# Patient Record
Sex: Female | Born: 1935 | Race: White | Hispanic: No | Marital: Married | Smoking: Never smoker
Health system: Southern US, Community
[De-identification: ages and names within clinical notes are randomized; demographics above are authoritative.]

## PROBLEM LIST (undated history)

## (undated) DIAGNOSIS — F0390 Unspecified dementia without behavioral disturbance: Secondary | ICD-10-CM

---

## 1998-01-24 ENCOUNTER — Emergency Department (HOSPITAL_COMMUNITY): Admission: EM | Admit: 1998-01-24 | Discharge: 1998-01-24 | Payer: Self-pay | Admitting: Emergency Medicine

## 1998-04-14 ENCOUNTER — Ambulatory Visit (HOSPITAL_COMMUNITY): Admission: RE | Admit: 1998-04-14 | Discharge: 1998-04-14 | Payer: Self-pay | Admitting: Gastroenterology

## 2001-11-15 ENCOUNTER — Other Ambulatory Visit: Admission: RE | Admit: 2001-11-15 | Discharge: 2001-11-15 | Payer: Self-pay | Admitting: Internal Medicine

## 2003-06-25 ENCOUNTER — Ambulatory Visit (HOSPITAL_COMMUNITY): Admission: RE | Admit: 2003-06-25 | Discharge: 2003-06-25 | Payer: Self-pay | Admitting: *Deleted

## 2005-11-09 ENCOUNTER — Other Ambulatory Visit: Admission: RE | Admit: 2005-11-09 | Discharge: 2005-11-09 | Payer: Self-pay | Admitting: Internal Medicine

## 2008-04-30 ENCOUNTER — Encounter (INDEPENDENT_AMBULATORY_CARE_PROVIDER_SITE_OTHER): Payer: Self-pay | Admitting: *Deleted

## 2008-04-30 ENCOUNTER — Ambulatory Visit (HOSPITAL_COMMUNITY): Admission: RE | Admit: 2008-04-30 | Discharge: 2008-04-30 | Payer: Self-pay | Admitting: *Deleted

## 2010-12-21 NOTE — Op Note (Signed)
NAME:  Gabriela Middleton, Gabriela Middleton NO.:  [id number]   MEDICAL RECORD NO.:  [id number]          PATIENT TYPE:  AMB   LOCATION:  ENDO                         FACILITY:  [facility]   PHYSICIAN:  Georgiana Spinner, M.D.    DATE OF BIRTH:  1924-01-18   DATE OF PROCEDURE:  04/30/2008  DATE OF DISCHARGE:                               OPERATIVE REPORT   PROCEDURE PERFORMED:  Upper endoscopy.   ENDOSCOPIST:  Georgiana Spinner, M.D.   INDICATIONS:  Abdominal pain.   ANESTHESIA:  Fentanyl 50 mcg and Versed 5 mg.   DESCRIPTION OF THE PROCEDURE:  With the patient mildly sedated in the  left lateral decubitus position the Pentax videoscopic endoscope was  inserted in the mouth and  passed under direct vision through the  esophagus, which appeared normal until we reached the distal esophagus.  There appeared to be an ulcer-related to probably reflux, which was  photographed and subsequently biopsied.   We entered into the stomach.  Fundus, body, antrum, duodenal bulb, and  second portion of the duodenum appeared normal.  From this point the  endoscope was slowly withdrawn taking circumferential views of duodenal  mucosa until the endoscope had been pulled back into stomach and placed  in retroflexion to view the stomach from below.   The endoscope was straightened and withdrawn taking circumferential  views of the remaining gastric and esophageal mucosa.   The patient's vital signs and pulse oximetry had remained stable.  The  patient tolerated procedure well with no apparent complications.   FINDINGS:  Loose wrap of the gastroesophageal junction around the  endoscope indicating laxity of the lower esophageal sphincter and an  esophageal ulcer was seen, photographed and biopsied.  We will await  biopsy report.  The patient will call me for the results and follow up  with me as an outpatient.  We will proceed with colonoscopy as planned.           ______________________________  Georgiana Spinner, M.D.     GMO/MEDQ  D:  04/30/2008  T:  05/01/2008  Job:  [id number]

## 2010-12-21 NOTE — Op Note (Signed)
NAME:  Gabriela Middleton, LYNDE NO.:  [id number]   MEDICAL RECORD NO.:  [id number]          PATIENT TYPE:  AMB   LOCATION:  ENDO                         FACILITY:  [facility]   PHYSICIAN:  Georgiana Spinner, M.D.    DATE OF BIRTH:  1924-01-21   DATE OF PROCEDURE:  DATE OF DISCHARGE:                               OPERATIVE REPORT   PROCEDURE:  Colonoscopy.   INDICATIONS:  Abdominal pain, colon cancer screening.   ANESTHESIA:  Fentanyl 25 mcg, Versed 2.5 mg.   DESCRIPTION OF PROCEDURE:  With the patient mildly sedated in the left  lateral decubitus position, the Pentax videoscopic pediatric colonoscope  was inserted in the rectum and passed under direct vision.  With  pressure applied and the patient rolled to her back and subsequently to  her right side, we were able to reach the cecum.  We identified the base  of cecum and ileocecal valve, both of which were photographed.  From  this point, colonoscope was slowly withdrawn, taking circumferential  views of colonic mucosa, stopping only in the rectum which appeared  normal on direct and showed hemorrhoids on retroflexed view.  The  endoscope was straightened and withdrawn.  The patient's vital signs and  pulse oximeter remained stable.  The patient tolerated the procedure  well without apparent complication.   FINDINGS:  Internal hemorrhoids.  Otherwise, an unremarkable  colonoscopic examination to the cecum.   PLAN:  See endoscopy note for further details.           ______________________________  Georgiana Spinner, M.D.     GMO/MEDQ  D:  04/30/2008  T:  05/01/2008  Job:  [id number]

## 2010-12-24 NOTE — Op Note (Signed)
NAME:  Gabriela Middleton, Gabriela Middleton                         ACCOUNT NO.:  [id number]   MEDICAL RECORD NO.:  [id number]                   PATIENT TYPE:  AMB   LOCATION:  ENDO                                 [facility]ACILITY:  [facility]   PHYSICIAN:  Georgiana Spinner, M.D.                 DATE OF BIRTH:  07-14-1924   DATE OF PROCEDURE:  DATE OF DISCHARGE:                                 OPERATIVE REPORT   PROCEDURE:  Colonoscopy.   INDICATIONS FOR PROCEDURE:  Colon polyps.   ANESTHESIA:  Demerol 40, Versed 4 mg, Phenergan 12.5 mg were given.   DESCRIPTION OF PROCEDURE:  With the patient mildly sedated in the left  lateral decubitus position, the Olympus videoscopic colonoscope was inserted  into the rectum and passed under direct vision to the cecum identified by  the ileocecal valve and appendiceal orifice both of which were photographed.  From this point, the colonoscope was slowly withdrawn taking circumferential  views of the colonic mucosa stopping only in the rectum which appeared  normal on direct and showed hemorrhoids on retroflexed view. The endoscope  was straightened and withdrawn. The patient's vital signs and pulse oximeter  remained stable. The patient tolerated the procedure well without apparent  complications.   FINDINGS:  Internal hemorrhoids otherwise unremarkable exam.   PLAN:  Repeat examination in five years.                                               Georgiana Spinner, M.D.    GMO/MEDQ  D:  06/25/2003  T:  06/25/2003  Job:  [id number]

## 2015-10-08 DIAGNOSIS — H401131 Primary open-angle glaucoma, bilateral, mild stage: Secondary | ICD-10-CM | POA: Diagnosis not present

## 2015-10-08 DIAGNOSIS — Z961 Presence of intraocular lens: Secondary | ICD-10-CM | POA: Diagnosis not present

## 2015-11-13 DIAGNOSIS — Z1231 Encounter for screening mammogram for malignant neoplasm of breast: Secondary | ICD-10-CM | POA: Diagnosis not present

## 2015-11-13 DIAGNOSIS — Z803 Family history of malignant neoplasm of breast: Secondary | ICD-10-CM | POA: Diagnosis not present

## 2016-01-19 DIAGNOSIS — E7800 Pure hypercholesterolemia, unspecified: Secondary | ICD-10-CM | POA: Diagnosis not present

## 2016-01-19 DIAGNOSIS — Z0000 Encounter for general adult medical examination without abnormal findings: Secondary | ICD-10-CM | POA: Diagnosis not present

## 2016-01-19 DIAGNOSIS — Z7982 Long term (current) use of aspirin: Secondary | ICD-10-CM | POA: Diagnosis not present

## 2016-01-19 DIAGNOSIS — E039 Hypothyroidism, unspecified: Secondary | ICD-10-CM | POA: Diagnosis not present

## 2016-01-26 DIAGNOSIS — Z0000 Encounter for general adult medical examination without abnormal findings: Secondary | ICD-10-CM | POA: Diagnosis not present

## 2016-01-26 DIAGNOSIS — E7800 Pure hypercholesterolemia, unspecified: Secondary | ICD-10-CM | POA: Diagnosis not present

## 2016-01-26 DIAGNOSIS — K5900 Constipation, unspecified: Secondary | ICD-10-CM | POA: Diagnosis not present

## 2016-01-26 DIAGNOSIS — Z8719 Personal history of other diseases of the digestive system: Secondary | ICD-10-CM | POA: Diagnosis not present

## 2016-02-15 DIAGNOSIS — M533 Sacrococcygeal disorders, not elsewhere classified: Secondary | ICD-10-CM | POA: Diagnosis not present

## 2016-07-26 DIAGNOSIS — Z23 Encounter for immunization: Secondary | ICD-10-CM | POA: Diagnosis not present

## 2016-08-30 DIAGNOSIS — H903 Sensorineural hearing loss, bilateral: Secondary | ICD-10-CM | POA: Diagnosis not present

## 2016-08-30 DIAGNOSIS — E039 Hypothyroidism, unspecified: Secondary | ICD-10-CM | POA: Diagnosis not present

## 2016-10-12 DIAGNOSIS — H353132 Nonexudative age-related macular degeneration, bilateral, intermediate dry stage: Secondary | ICD-10-CM | POA: Diagnosis not present

## 2016-10-12 DIAGNOSIS — H401131 Primary open-angle glaucoma, bilateral, mild stage: Secondary | ICD-10-CM | POA: Diagnosis not present

## 2016-10-12 DIAGNOSIS — H35372 Puckering of macula, left eye: Secondary | ICD-10-CM | POA: Diagnosis not present

## 2016-10-12 DIAGNOSIS — Z961 Presence of intraocular lens: Secondary | ICD-10-CM | POA: Diagnosis not present

## 2016-11-23 DIAGNOSIS — E039 Hypothyroidism, unspecified: Secondary | ICD-10-CM | POA: Diagnosis not present

## 2017-02-28 DIAGNOSIS — Z0000 Encounter for general adult medical examination without abnormal findings: Secondary | ICD-10-CM | POA: Diagnosis not present

## 2017-04-19 DIAGNOSIS — Z7982 Long term (current) use of aspirin: Secondary | ICD-10-CM | POA: Diagnosis not present

## 2017-04-19 DIAGNOSIS — E7800 Pure hypercholesterolemia, unspecified: Secondary | ICD-10-CM | POA: Diagnosis not present

## 2017-04-19 DIAGNOSIS — I10 Essential (primary) hypertension: Secondary | ICD-10-CM | POA: Diagnosis not present

## 2017-04-19 DIAGNOSIS — M8580 Other specified disorders of bone density and structure, unspecified site: Secondary | ICD-10-CM | POA: Diagnosis not present

## 2017-04-26 DIAGNOSIS — E039 Hypothyroidism, unspecified: Secondary | ICD-10-CM | POA: Diagnosis not present

## 2017-04-26 DIAGNOSIS — Z0001 Encounter for general adult medical examination with abnormal findings: Secondary | ICD-10-CM | POA: Diagnosis not present

## 2017-04-26 DIAGNOSIS — R9431 Abnormal electrocardiogram [ECG] [EKG]: Secondary | ICD-10-CM | POA: Diagnosis not present

## 2017-04-26 DIAGNOSIS — K219 Gastro-esophageal reflux disease without esophagitis: Secondary | ICD-10-CM | POA: Diagnosis not present

## 2017-05-03 DIAGNOSIS — Z1212 Encounter for screening for malignant neoplasm of rectum: Secondary | ICD-10-CM | POA: Diagnosis not present

## 2017-05-22 DIAGNOSIS — M8580 Other specified disorders of bone density and structure, unspecified site: Secondary | ICD-10-CM | POA: Diagnosis not present

## 2017-05-22 DIAGNOSIS — Z780 Asymptomatic menopausal state: Secondary | ICD-10-CM | POA: Diagnosis not present

## 2017-05-22 DIAGNOSIS — Z01419 Encounter for gynecological examination (general) (routine) without abnormal findings: Secondary | ICD-10-CM | POA: Diagnosis not present

## 2017-08-31 DIAGNOSIS — K5901 Slow transit constipation: Secondary | ICD-10-CM | POA: Diagnosis not present

## 2017-08-31 DIAGNOSIS — R509 Fever, unspecified: Secondary | ICD-10-CM | POA: Diagnosis not present

## 2017-09-04 DIAGNOSIS — H6123 Impacted cerumen, bilateral: Secondary | ICD-10-CM | POA: Diagnosis not present

## 2017-10-12 DIAGNOSIS — H401131 Primary open-angle glaucoma, bilateral, mild stage: Secondary | ICD-10-CM | POA: Diagnosis not present

## 2017-10-12 DIAGNOSIS — H43823 Vitreomacular adhesion, bilateral: Secondary | ICD-10-CM | POA: Diagnosis not present

## 2017-10-12 DIAGNOSIS — H353131 Nonexudative age-related macular degeneration, bilateral, early dry stage: Secondary | ICD-10-CM | POA: Diagnosis not present

## 2017-10-12 DIAGNOSIS — H35373 Puckering of macula, bilateral: Secondary | ICD-10-CM | POA: Diagnosis not present

## 2017-10-30 DIAGNOSIS — H9201 Otalgia, right ear: Secondary | ICD-10-CM | POA: Diagnosis not present

## 2017-10-30 DIAGNOSIS — H9113 Presbycusis, bilateral: Secondary | ICD-10-CM | POA: Diagnosis not present

## 2018-09-10 DIAGNOSIS — E7800 Pure hypercholesterolemia, unspecified: Secondary | ICD-10-CM | POA: Diagnosis not present

## 2018-09-10 DIAGNOSIS — I10 Essential (primary) hypertension: Secondary | ICD-10-CM | POA: Diagnosis not present

## 2018-10-04 DIAGNOSIS — Z0120 Encounter for dental examination and cleaning without abnormal findings: Secondary | ICD-10-CM | POA: Diagnosis not present

## 2018-10-10 DIAGNOSIS — H6123 Impacted cerumen, bilateral: Secondary | ICD-10-CM | POA: Diagnosis not present

## 2018-10-17 DIAGNOSIS — E039 Hypothyroidism, unspecified: Secondary | ICD-10-CM | POA: Diagnosis not present

## 2018-10-17 DIAGNOSIS — G609 Hereditary and idiopathic neuropathy, unspecified: Secondary | ICD-10-CM | POA: Diagnosis not present

## 2018-10-17 DIAGNOSIS — I10 Essential (primary) hypertension: Secondary | ICD-10-CM | POA: Diagnosis not present

## 2018-10-17 DIAGNOSIS — Z0000 Encounter for general adult medical examination without abnormal findings: Secondary | ICD-10-CM | POA: Diagnosis not present

## 2018-10-18 DIAGNOSIS — Z961 Presence of intraocular lens: Secondary | ICD-10-CM | POA: Diagnosis not present

## 2018-10-18 DIAGNOSIS — H401131 Primary open-angle glaucoma, bilateral, mild stage: Secondary | ICD-10-CM | POA: Diagnosis not present

## 2018-10-18 DIAGNOSIS — H35373 Puckering of macula, bilateral: Secondary | ICD-10-CM | POA: Diagnosis not present

## 2018-10-18 DIAGNOSIS — H43823 Vitreomacular adhesion, bilateral: Secondary | ICD-10-CM | POA: Diagnosis not present

## 2019-04-11 DIAGNOSIS — Z0120 Encounter for dental examination and cleaning without abnormal findings: Secondary | ICD-10-CM | POA: Diagnosis not present

## 2019-09-13 DIAGNOSIS — K5900 Constipation, unspecified: Secondary | ICD-10-CM | POA: Diagnosis not present

## 2019-09-13 DIAGNOSIS — K649 Unspecified hemorrhoids: Secondary | ICD-10-CM | POA: Diagnosis not present

## 2019-10-15 DIAGNOSIS — Z0120 Encounter for dental examination and cleaning without abnormal findings: Secondary | ICD-10-CM | POA: Diagnosis not present

## 2019-10-21 DIAGNOSIS — N390 Urinary tract infection, site not specified: Secondary | ICD-10-CM | POA: Diagnosis not present

## 2019-10-21 DIAGNOSIS — E039 Hypothyroidism, unspecified: Secondary | ICD-10-CM | POA: Diagnosis not present

## 2019-10-21 DIAGNOSIS — E7800 Pure hypercholesterolemia, unspecified: Secondary | ICD-10-CM | POA: Diagnosis not present

## 2019-10-21 DIAGNOSIS — I10 Essential (primary) hypertension: Secondary | ICD-10-CM | POA: Diagnosis not present

## 2019-10-29 DIAGNOSIS — S22080A Wedge compression fracture of T11-T12 vertebra, initial encounter for closed fracture: Secondary | ICD-10-CM | POA: Diagnosis not present

## 2019-10-29 DIAGNOSIS — S22000A Wedge compression fracture of unspecified thoracic vertebra, initial encounter for closed fracture: Secondary | ICD-10-CM | POA: Diagnosis not present

## 2019-10-29 DIAGNOSIS — M545 Low back pain: Secondary | ICD-10-CM | POA: Diagnosis not present

## 2019-11-02 DIAGNOSIS — M545 Low back pain: Secondary | ICD-10-CM | POA: Diagnosis not present

## 2019-11-12 DIAGNOSIS — M8008XA Age-related osteoporosis with current pathological fracture, vertebra(e), initial encounter for fracture: Secondary | ICD-10-CM | POA: Diagnosis not present

## 2019-11-12 DIAGNOSIS — S22080A Wedge compression fracture of T11-T12 vertebra, initial encounter for closed fracture: Secondary | ICD-10-CM | POA: Diagnosis not present

## 2019-11-15 DIAGNOSIS — M8008XA Age-related osteoporosis with current pathological fracture, vertebra(e), initial encounter for fracture: Secondary | ICD-10-CM | POA: Diagnosis not present

## 2019-11-28 DIAGNOSIS — E039 Hypothyroidism, unspecified: Secondary | ICD-10-CM | POA: Diagnosis not present

## 2019-11-28 DIAGNOSIS — E7800 Pure hypercholesterolemia, unspecified: Secondary | ICD-10-CM | POA: Diagnosis not present

## 2019-11-28 DIAGNOSIS — I10 Essential (primary) hypertension: Secondary | ICD-10-CM | POA: Diagnosis not present

## 2019-11-28 DIAGNOSIS — Z0000 Encounter for general adult medical examination without abnormal findings: Secondary | ICD-10-CM | POA: Diagnosis not present

## 2019-12-06 DIAGNOSIS — M8008XD Age-related osteoporosis with current pathological fracture, vertebra(e), subsequent encounter for fracture with routine healing: Secondary | ICD-10-CM | POA: Diagnosis not present

## 2019-12-06 DIAGNOSIS — M810 Age-related osteoporosis without current pathological fracture: Secondary | ICD-10-CM | POA: Diagnosis not present

## 2020-01-15 DIAGNOSIS — Z0120 Encounter for dental examination and cleaning without abnormal findings: Secondary | ICD-10-CM | POA: Diagnosis not present

## 2020-02-05 DIAGNOSIS — H6123 Impacted cerumen, bilateral: Secondary | ICD-10-CM | POA: Diagnosis not present

## 2020-05-29 DIAGNOSIS — Z23 Encounter for immunization: Secondary | ICD-10-CM | POA: Diagnosis not present

## 2020-05-29 DIAGNOSIS — E7800 Pure hypercholesterolemia, unspecified: Secondary | ICD-10-CM | POA: Diagnosis not present

## 2020-05-29 DIAGNOSIS — E039 Hypothyroidism, unspecified: Secondary | ICD-10-CM | POA: Diagnosis not present

## 2020-06-04 DIAGNOSIS — E7800 Pure hypercholesterolemia, unspecified: Secondary | ICD-10-CM | POA: Diagnosis not present

## 2020-06-04 DIAGNOSIS — I10 Essential (primary) hypertension: Secondary | ICD-10-CM | POA: Diagnosis not present

## 2020-06-04 DIAGNOSIS — E039 Hypothyroidism, unspecified: Secondary | ICD-10-CM | POA: Diagnosis not present

## 2020-11-26 DIAGNOSIS — E039 Hypothyroidism, unspecified: Secondary | ICD-10-CM | POA: Diagnosis not present

## 2020-12-03 DIAGNOSIS — Z0000 Encounter for general adult medical examination without abnormal findings: Secondary | ICD-10-CM | POA: Diagnosis not present

## 2020-12-03 DIAGNOSIS — I10 Essential (primary) hypertension: Secondary | ICD-10-CM | POA: Diagnosis not present

## 2020-12-03 DIAGNOSIS — N183 Chronic kidney disease, stage 3 unspecified: Secondary | ICD-10-CM | POA: Diagnosis not present

## 2020-12-03 DIAGNOSIS — E7800 Pure hypercholesterolemia, unspecified: Secondary | ICD-10-CM | POA: Diagnosis not present

## 2021-05-25 DIAGNOSIS — E7800 Pure hypercholesterolemia, unspecified: Secondary | ICD-10-CM | POA: Diagnosis not present

## 2021-06-01 DIAGNOSIS — I10 Essential (primary) hypertension: Secondary | ICD-10-CM | POA: Diagnosis not present

## 2021-06-01 DIAGNOSIS — E039 Hypothyroidism, unspecified: Secondary | ICD-10-CM | POA: Diagnosis not present

## 2021-06-01 DIAGNOSIS — E7800 Pure hypercholesterolemia, unspecified: Secondary | ICD-10-CM | POA: Diagnosis not present

## 2021-06-01 DIAGNOSIS — N1831 Chronic kidney disease, stage 3a: Secondary | ICD-10-CM | POA: Diagnosis not present

## 2021-12-07 ENCOUNTER — Other Ambulatory Visit: Payer: Self-pay

## 2021-12-07 ENCOUNTER — Emergency Department (HOSPITAL_BASED_OUTPATIENT_CLINIC_OR_DEPARTMENT_OTHER)
Admission: EM | Admit: 2021-12-07 | Discharge: 2021-12-08 | Disposition: A | Discharge status: Home / Self Care | Payer: Medicare Other | Attending: Emergency Medicine | Admitting: Emergency Medicine

## 2021-12-07 ENCOUNTER — Emergency Department (HOSPITAL_BASED_OUTPATIENT_CLINIC_OR_DEPARTMENT_OTHER): Payer: Medicare Other

## 2021-12-07 ENCOUNTER — Encounter (HOSPITAL_BASED_OUTPATIENT_CLINIC_OR_DEPARTMENT_OTHER): Payer: Self-pay | Admitting: Emergency Medicine

## 2021-12-07 DIAGNOSIS — R262 Difficulty in walking, not elsewhere classified: Secondary | ICD-10-CM | POA: Diagnosis not present

## 2021-12-07 DIAGNOSIS — R42 Dizziness and giddiness: Secondary | ICD-10-CM | POA: Diagnosis not present

## 2021-12-07 DIAGNOSIS — H6121 Impacted cerumen, right ear: Secondary | ICD-10-CM | POA: Diagnosis not present

## 2021-12-07 DIAGNOSIS — I700 Atherosclerosis of aorta: Secondary | ICD-10-CM | POA: Diagnosis not present

## 2021-12-07 DIAGNOSIS — I6523 Occlusion and stenosis of bilateral carotid arteries: Secondary | ICD-10-CM | POA: Diagnosis not present

## 2021-12-07 LAB — CBC WITH DIFFERENTIAL/PLATELET
Abs Immature Granulocytes: 0.01 10*3/uL (ref 0.00–0.07)
Basophils Absolute: 0 10*3/uL (ref 0.0–0.1)
Basophils Relative: 0 %
Eosinophils Absolute: 0 10*3/uL (ref 0.0–0.5)
Eosinophils Relative: 0 %
HCT: 35.3 % — ABNORMAL LOW (ref 36.0–46.0)
Hemoglobin: 11.5 g/dL — ABNORMAL LOW (ref 12.0–15.0)
Immature Granulocytes: 0 %
Lymphocytes Relative: 26 %
Lymphs Abs: 1.7 10*3/uL (ref 0.7–4.0)
MCH: 28.6 pg (ref 26.0–34.0)
MCHC: 32.6 g/dL (ref 30.0–36.0)
MCV: 87.8 fL (ref 80.0–100.0)
Monocytes Absolute: 0.3 10*3/uL (ref 0.1–1.0)
Monocytes Relative: 5 %
Neutro Abs: 4.3 10*3/uL (ref 1.7–7.7)
Neutrophils Relative %: 69 %
Platelets: 196 10*3/uL (ref 150–400)
RBC: 4.02 MIL/uL (ref 3.87–5.11)
RDW: 12.5 % (ref 11.5–15.5)
WBC: 6.3 10*3/uL (ref 4.0–10.5)
nRBC: 0 % (ref 0.0–0.2)

## 2021-12-07 LAB — COMPREHENSIVE METABOLIC PANEL
ALT: 11 U/L (ref 0–44)
AST: 14 U/L — ABNORMAL LOW (ref 15–41)
Albumin: 4.3 g/dL (ref 3.5–5.0)
Alkaline Phosphatase: 78 U/L (ref 38–126)
Anion gap: 11 (ref 5–15)
BUN: 30 mg/dL — ABNORMAL HIGH (ref 8–23)
CO2: 22 mmol/L (ref 22–32)
Calcium: 9.4 mg/dL (ref 8.9–10.3)
Chloride: 103 mmol/L (ref 98–111)
Creatinine, Ser: 1.21 mg/dL — ABNORMAL HIGH (ref 0.44–1.00)
GFR, Estimated: 41 mL/min — ABNORMAL LOW (ref >60)
Glucose, Bld: 116 mg/dL — ABNORMAL HIGH (ref 70–99)
Potassium: 4.1 mmol/L (ref 3.5–5.1)
Sodium: 136 mmol/L (ref 135–145)
Total Bilirubin: 0.7 mg/dL (ref 0.3–1.2)
Total Protein: 7.9 g/dL (ref 6.5–8.1)

## 2021-12-07 LAB — URINALYSIS, ROUTINE W REFLEX MICROSCOPIC
Bilirubin Urine: NEGATIVE
Glucose, UA: NEGATIVE mg/dL
Hgb urine dipstick: NEGATIVE
Ketones, ur: NEGATIVE mg/dL
Leukocytes,Ua: NEGATIVE
Nitrite: NEGATIVE
Protein, ur: NEGATIVE mg/dL
Specific Gravity, Urine: 1.021 (ref 1.005–1.030)
pH: 6 (ref 5.0–8.0)

## 2021-12-07 LAB — TROPONIN I (HIGH SENSITIVITY)
Troponin I (High Sensitivity): 15 ng/L (ref <18)
Troponin I (High Sensitivity): 17 ng/L (ref <18)

## 2021-12-07 MED ORDER — METOCLOPRAMIDE HCL 5 MG/ML IJ SOLN
5.0000 mg | Freq: Once | INTRAMUSCULAR | Status: AC
  Administered 2021-12-07: 5 mg via INTRAVENOUS
  Filled 2021-12-07: qty 2

## 2021-12-07 MED ORDER — MECLIZINE HCL 25 MG PO TABS
12.5000 mg | ORAL_TABLET | Freq: Once | ORAL | Status: AC
  Administered 2021-12-07: 12.5 mg via ORAL
  Filled 2021-12-07: qty 1

## 2021-12-07 MED ORDER — IOHEXOL 350 MG/ML SOLN
100.0000 mL | Freq: Once | INTRAVENOUS | Status: AC | PRN
  Administered 2021-12-07: 60 mL via INTRAVENOUS

## 2021-12-07 MED ORDER — SODIUM CHLORIDE 0.9 % IV BOLUS
1000.0000 mL | Freq: Once | INTRAVENOUS | Status: AC
  Administered 2021-12-07: 1000 mL via INTRAVENOUS

## 2021-12-07 MED ORDER — DIPHENHYDRAMINE HCL 50 MG/ML IJ SOLN
12.5000 mg | Freq: Once | INTRAMUSCULAR | Status: DC
Start: 2021-12-07 — End: 2021-12-08

## 2021-12-07 NOTE — ED Notes (Signed)
[phone] Jerelene Redden pt son phone number  ?

## 2021-12-07 NOTE — ED Triage Notes (Signed)
Pt from home. Last known well was Sunday when her daughter in law saw her. Today at approx 1430 called son said she felt dizzy. Pt walks with a walker and Son states that she is a little off balance as best as he can tell. ? ?Pt currently denis being dizzy and says she was dizzy a couple of hours ago and is ok now. Pt states she is walking fine and denis headache or blurry vision.  ? ?Pt alert and oriented x 4, GCS 15 ?

## 2021-12-07 NOTE — ED Notes (Signed)
Patient transported to CT 

## 2021-12-07 NOTE — ED Provider Notes (Signed)
[facility].  Patient is 88 years old and feeling a bit dizzy.  Worse when she stands up and tries to walk.  She feels fine while laying in the bed.  She turns her head rapidly back and forth without any dizziness.  Will give a bolus of IV fluids for possible dehydration.  Headache cocktail for dizziness.  Await MRI. ? ?Signed out to Dr. Bebe Shaggy, awaiting MRI.  ?  Melene Plan, DO ?12/07/21 2325 ? ?

## 2021-12-07 NOTE — ED Notes (Addendum)
Pt arrived from [facility] facility, pt arrived at their facility with a c/o dizziness.pt denies n/v. Pt was given 25mg  PO meclinzine at [facility]. Pt had a negative CT Head at the facility and has been transferred for an MRI scan. Pt alert upon arrival. Scan and medications pending.  ?

## 2021-12-07 NOTE — ED Provider Notes (Signed)
?[facility] EMERGENCY DEPT ?Provider Note ? ? ?CSN: [id number] ?Arrival date & time: 12/07/21  1553 ? ?  ? ?History ? ?Chief Complaint  ?Patient presents with  ? Dizziness  ? ? ?Gabriela Middleton is a 88 y.o. female. ? ?Level 5 caveat for hearing difficulty.  Patient here complaining of "dizziness".  Last seen normal was last night.  States she woke up feeling dizzy this morning but has been coming and going throughout the day.  She is a difficult time describing the dizziness but states it is spinning worse with position changes.  Son noticed she was having some difficulty walking and seemed more off balance than usual.  When she first got to the hospital patient states she was not dizzy but now is complaining of dizziness again.  Denies any focal weakness, numbness or tingling.  No visual changes.  No headache.  No chest pain or shortness of breath.  No cough or fever. ?No history of similar symptoms. ?Does have a history of hypothyroidism, hypertension and high cholesterol. ? ?The history is provided by the patient and a relative.  ?Dizziness ?Associated symptoms: no chest pain, no headaches, no nausea, no shortness of breath, no vomiting and no weakness   ? ?  ? ?Home Medications ?Prior to Admission medications   ?Not on File  ?   ? ?Allergies    ?Patient has no allergy information on record.   ? ?Review of Systems   ?Review of Systems  ?Constitutional:  Negative for activity change, appetite change and fever.  ?HENT:  Negative for congestion and rhinorrhea.   ?Eyes:  Negative for photophobia.  ?Respiratory:  Negative for cough, chest tightness and shortness of breath.   ?Cardiovascular:  Negative for chest pain.  ?Gastrointestinal:  Negative for abdominal pain, nausea and vomiting.  ?Genitourinary:  Negative for dysuria and hematuria.  ?Musculoskeletal:  Negative for arthralgias and myalgias.  ?Skin:  Negative for rash.  ?Neurological:  Positive for dizziness. Negative for weakness and headaches.  ?  all other systems are negative except as noted in the HPI and PMH.  ? ?Physical Exam ?Updated Vital Signs ?BP (!) 163/72 (BP Location: Right Arm)   Pulse 72   Temp 98.3 ?F (36.8 ?C)   Resp 16   Ht 5\' 2"  (1.575 m)   SpO2 100%  ?Physical Exam ?Vitals and nursing note reviewed.  ?Constitutional:   ?   General: She is not in acute distress. ?   Appearance: She is well-developed.  ?HENT:  ?   Head: Normocephalic and atraumatic.  ?   Right Ear: There is impacted cerumen.  ?   Mouth/Throat:  ?   Pharynx: No oropharyngeal exudate.  ?Eyes:  ?   Conjunctiva/sclera: Conjunctivae normal.  ?   Pupils: Pupils are equal, round, and reactive to light.  ?Neck:  ?   Comments: No meningismus. ?Cardiovascular:  ?   Rate and Rhythm: Normal rate and regular rhythm.  ?   Heart sounds: Normal heart sounds. No murmur heard. ?Pulmonary:  ?   Effort: Pulmonary effort is normal. No respiratory distress.  ?   Breath sounds: Normal breath sounds.  ?Abdominal:  ?   Palpations: Abdomen is soft.  ?   Tenderness: There is no abdominal tenderness. There is no guarding or rebound.  ?Musculoskeletal:     ?   General: No tenderness. Normal range of motion.  ?   Cervical back: Normal range of motion and neck supple.  ?Skin: ?   General:  Skin is warm.  ?Neurological:  ?   Mental Status: She is alert and oriented to person, place, and time.  ?   Cranial Nerves: No cranial nerve deficit.  ?   Motor: No abnormal muscle tone.  ?   Coordination: Coordination normal.  ?   Comments: CN 2-12 intact, no ataxia on finger to nose, no nystagmus, 5/5 strength throughout, no pronator drift,  ? ?Unable to sit on the side of the bed or stand due to dizziness.  No appreciable nystagmus.  No ataxia on finger-to-nose  ?Psychiatric:     ?   Behavior: Behavior normal.  ? ? ?ED Results / Procedures / Treatments   ?Labs ?(all labs ordered are listed, but only abnormal results are displayed) ?Labs Reviewed  ?CBC WITH DIFFERENTIAL/PLATELET - Abnormal; Notable for the  following components:  ?    Result Value  ? Hemoglobin 11.5 (*)   ? HCT 35.3 (*)   ? All other components within normal limits  ?COMPREHENSIVE METABOLIC PANEL - Abnormal; Notable for the following components:  ? Glucose, Bld 116 (*)   ? BUN 30 (*)   ? Creatinine, Ser 1.21 (*)   ? AST 14 (*)   ? GFR, Estimated 41 (*)   ? All other components within normal limits  ?URINALYSIS, ROUTINE W REFLEX MICROSCOPIC - Abnormal; Notable for the following components:  ? Color, Urine COLORLESS (*)   ? All other components within normal limits  ?TROPONIN I (HIGH SENSITIVITY)  ?TROPONIN I (HIGH SENSITIVITY)  ? ? ?EKG ?EKG Interpretation ? ?Date/Time:  Tuesday Dec 07 2021 16:06:56 EDT ?Ventricular Rate:  66 ?PR Interval:  152 ?QRS Duration: 116 ?QT Interval:  440 ?QTC Calculation: 461 ?R Axis:   41 ?Text Interpretation: Normal sinus rhythm with sinus arrhythmia Right bundle branch block Septal infarct , age undetermined Abnormal ECG No previous ECGs available No previous ECGs available Confirmed by Glynn Octaveancour, Wesam Gearhart [phone]) on 12/07/2021 4:32:23 PM ? ?Radiology ?CT ANGIO HEAD NECK W WO CM ? ?Result Date: 12/07/2021 ?CLINICAL DATA:  Vertigo, central. EXAM: CT ANGIOGRAPHY HEAD AND NECK TECHNIQUE: Multidetector CT imaging of the head and neck was performed using the standard protocol during bolus administration of intravenous contrast. Multiplanar CT image reconstructions and MIPs were obtained to evaluate the vascular anatomy. Carotid stenosis measurements (when applicable) are obtained utilizing NASCET criteria, using the distal internal carotid diameter as the denominator. RADIATION DOSE REDUCTION: This exam was performed according to the departmental dose-optimization program which includes automated exposure control, adjustment of the mA and/or kV according to patient size and/or use of iterative reconstruction technique. CONTRAST:  60mL OMNIPAQUE IOHEXOL 350 MG/ML SOLN COMPARISON:  None Available. FINDINGS: CT HEAD FINDINGS Brain: There  is no evidence of an acute infarct, intracranial hemorrhage, mass, midline shift, or extra-axial fluid collection. Mild cerebral atrophy is within normal limits for age. Patchy hypodensities in the cerebral white matter bilaterally are nonspecific but compatible with moderate chronic small vessel ischemic disease. Vascular: Calcified atherosclerosis at the skull base. Skull: No fracture or suspicious osseous lesion. Sinuses: Paranasal sinuses and mastoid air cells are clear. Orbits: Bilateral cataract extraction. Review of the MIP images confirms the above findings CTA NECK FINDINGS Aortic arch: Standard 3 vessel aortic arch with mild atherosclerotic plaque. No significant arch vessel origin stenosis. Right carotid system: Patent without evidence of stenosis, dissection, or significant atherosclerosis. Tortuous proximal common carotid artery. Left carotid system: Patent without evidence of stenosis, dissection, or significant atherosclerosis. Vertebral arteries: Patent without evidence of stenosis,  dissection, or significant atherosclerosis. Codominant. Skeleton: No suspicious osseous lesion. Other neck: No evidence of cervical lymphadenopathy or mass. Upper chest: No apical lung consolidation or mass. Review of the MIP images confirms the above findings CTA HEAD FINDINGS Anterior circulation: The internal carotid arteries are patent from skull base to carotid termini with mild atherosclerotic plaque bilaterally not resulting in significant stenosis. ACAs and MCAs are patent without evidence of a proximal branch occlusion or significant proximal stenosis. No aneurysm is identified. Posterior circulation: The intracranial vertebral arteries are patent to the basilar with mild atherosclerotic plaque on the right but no significant stenosis. Patent PICA and SCA origins are seen bilaterally. The basilar artery is widely patent. Posterior communicating arteries are diminutive or absent. Both PCAs are patent without  evidence of a significant proximal stenosis. No aneurysm is identified. Venous sinuses: Patent. Anatomic variants: None. Review of the MIP images confirms the above findings IMPRESSION: 1. Mild atherosclerosis without

## 2021-12-08 ENCOUNTER — Telehealth: Payer: Self-pay

## 2021-12-08 ENCOUNTER — Emergency Department (HOSPITAL_COMMUNITY): Payer: Medicare Other

## 2021-12-08 DIAGNOSIS — R42 Dizziness and giddiness: Secondary | ICD-10-CM | POA: Diagnosis not present

## 2021-12-08 MED ORDER — MECLIZINE HCL 25 MG PO TABS
25.0000 mg | ORAL_TABLET | Freq: Three times a day (TID) | ORAL | 0 refills | Status: AC | PRN
Start: 2021-12-08 — End: ?

## 2021-12-08 NOTE — ED Provider Notes (Signed)
I assumed care in signout to follow-up on MRI.  Patient had presented with vertigo and was unable to walk.  MRI brain is negative for acute stroke.  Patient was able to ambulate with her walker.  She reported some dizziness but was steady on her feet and did not fall. ?I had a long discussion with patient and her son at the bedside.  Given that there is no acute stroke and she is now ambulating, patient will be discharged home.  She will be started on meclizine.  I discussed that she may have drowsiness with this medication ?Patient lives alone but her son checks on her frequently.  She does not drive.  I suspect this will continue to improve over the next several days.  She was advised to use her walker.  We will also order home health for patient; patient and family are agreeable with that plan ?  ?Zadie Rhine, MD ?12/08/21 [phone] ? ?

## 2021-12-08 NOTE — ED Notes (Signed)
Ambulate patient up to side of bed ,places walker in front of patient,she stood up began using walker to bathroom and back to bed ?

## 2021-12-08 NOTE — Telephone Encounter (Signed)
RNCM called patient's son to advise [facility] has accepted his mother for [facility] services and will contact them within 24-48 hours. RNCM unable to leave a voicemail due to patient's voicemail has not been set up.  ?

## 2021-12-08 NOTE — Discharge Instructions (Addendum)

## 2021-12-08 NOTE — Telephone Encounter (Signed)
RNCM received TOC consult for [facility]: PT, OT, RN, HHA. RNCM spoke with patient's son Jonny Ruiz (who goes by [employer]) [phone]. Loraine Leriche reports patient is [facility] and is best to call his phone. Patient used [facility] in the past. Patient will accept any agency that accepts her insurance. TOC will continue to follow ?

## 2021-12-10 DIAGNOSIS — Z9181 History of falling: Secondary | ICD-10-CM | POA: Diagnosis not present

## 2021-12-10 DIAGNOSIS — E039 Hypothyroidism, unspecified: Secondary | ICD-10-CM | POA: Diagnosis not present

## 2021-12-10 DIAGNOSIS — M6281 Muscle weakness (generalized): Secondary | ICD-10-CM | POA: Diagnosis not present

## 2021-12-10 DIAGNOSIS — E7800 Pure hypercholesterolemia, unspecified: Secondary | ICD-10-CM | POA: Diagnosis not present

## 2021-12-10 DIAGNOSIS — H814 Vertigo of central origin: Secondary | ICD-10-CM | POA: Diagnosis not present

## 2021-12-10 DIAGNOSIS — Z79899 Other long term (current) drug therapy: Secondary | ICD-10-CM | POA: Diagnosis not present

## 2021-12-10 DIAGNOSIS — I10 Essential (primary) hypertension: Secondary | ICD-10-CM | POA: Diagnosis not present

## 2021-12-13 ENCOUNTER — Emergency Department (HOSPITAL_COMMUNITY)
Admission: EM | Admit: 2021-12-13 | Discharge: 2021-12-13 | Disposition: A | Discharge status: Home / Self Care | Payer: Medicare Other | Attending: Emergency Medicine | Admitting: Emergency Medicine

## 2021-12-13 ENCOUNTER — Other Ambulatory Visit: Payer: Self-pay

## 2021-12-13 DIAGNOSIS — R42 Dizziness and giddiness: Secondary | ICD-10-CM | POA: Diagnosis not present

## 2021-12-13 DIAGNOSIS — E039 Hypothyroidism, unspecified: Secondary | ICD-10-CM | POA: Insufficient documentation

## 2021-12-13 DIAGNOSIS — Z79899 Other long term (current) drug therapy: Secondary | ICD-10-CM | POA: Diagnosis not present

## 2021-12-13 DIAGNOSIS — I10 Essential (primary) hypertension: Secondary | ICD-10-CM | POA: Diagnosis not present

## 2021-12-13 LAB — COMPREHENSIVE METABOLIC PANEL
ALT: 13 U/L (ref 0–44)
AST: 16 U/L (ref 15–41)
Albumin: 3.8 g/dL (ref 3.5–5.0)
Alkaline Phosphatase: 69 U/L (ref 38–126)
Anion gap: 6 (ref 5–15)
BUN: 18 mg/dL (ref 8–23)
CO2: 25 mmol/L (ref 22–32)
Calcium: 9.1 mg/dL (ref 8.9–10.3)
Chloride: 106 mmol/L (ref 98–111)
Creatinine, Ser: 1.33 mg/dL — ABNORMAL HIGH (ref 0.44–1.00)
GFR, Estimated: 37 mL/min — ABNORMAL LOW (ref >60)
Glucose, Bld: 98 mg/dL (ref 70–99)
Potassium: 4.3 mmol/L (ref 3.5–5.1)
Sodium: 137 mmol/L (ref 135–145)
Total Bilirubin: 0.9 mg/dL (ref 0.3–1.2)
Total Protein: 7.5 g/dL (ref 6.5–8.1)

## 2021-12-13 LAB — CBC WITH DIFFERENTIAL/PLATELET
Abs Immature Granulocytes: 0.02 10*3/uL (ref 0.00–0.07)
Basophils Absolute: 0 10*3/uL (ref 0.0–0.1)
Basophils Relative: 0 %
Eosinophils Absolute: 0.1 10*3/uL (ref 0.0–0.5)
Eosinophils Relative: 1 %
HCT: 34.7 % — ABNORMAL LOW (ref 36.0–46.0)
Hemoglobin: 11.6 g/dL — ABNORMAL LOW (ref 12.0–15.0)
Immature Granulocytes: 0 %
Lymphocytes Relative: 39 %
Lymphs Abs: 2.7 10*3/uL (ref 0.7–4.0)
MCH: 30 pg (ref 26.0–34.0)
MCHC: 33.4 g/dL (ref 30.0–36.0)
MCV: 89.7 fL (ref 80.0–100.0)
Monocytes Absolute: 0.4 10*3/uL (ref 0.1–1.0)
Monocytes Relative: 6 %
Neutro Abs: 3.7 10*3/uL (ref 1.7–7.7)
Neutrophils Relative %: 54 %
Platelets: 198 10*3/uL (ref 150–400)
RBC: 3.87 MIL/uL (ref 3.87–5.11)
RDW: 12.5 % (ref 11.5–15.5)
WBC: 6.9 10*3/uL (ref 4.0–10.5)
nRBC: 0 % (ref 0.0–0.2)

## 2021-12-13 LAB — TROPONIN I (HIGH SENSITIVITY)
Troponin I (High Sensitivity): 18 ng/L — ABNORMAL HIGH (ref <18)
Troponin I (High Sensitivity): 20 ng/L — ABNORMAL HIGH (ref <18)

## 2021-12-13 NOTE — ED Provider Triage Note (Signed)
Emergency Medicine Provider Triage Evaluation Note ? ?Gabriela Middleton , a 88 y.o. female  was evaluated in triage.  Pt complains of dizziness.  Patient is alert to person and place only.  Has a history of dementia. ? ?Per EMS patient's son reports that he is concerned that patient has continued to have dizziness despite taking her meclizine.  Patient's son is also concerned about earwax noted in patient's ears. ? ?Review of Systems  ?Positive: Dizziness ?Negative:  ? ?Unable to complete due to dementia ? ?Physical Exam  ?There were no vitals taken for this visit. ?Gen:   Awake, no distress   ?Resp:  Normal effort  ?MSK:   Moves extremities without difficulty  ?Other:  No facial asymmetry or dysarthria.  Pronator drift negative.  +5 strength to bilateral upper and lower extremities.  Follows commands without difficulty. ? ?Medical Decision Making  ?Medically screening exam initiated at 2:42 PM.  Appropriate orders placed.  Gabriela Middleton was informed that the remainder of the evaluation will be completed by another provider, this initial triage assessment does not replace that evaluation, and the importance of remaining in the ED until their evaluation is complete. ? ?Per chart review patient was seen on 5/2 for similar symptoms.  Patient had MRI which showed no acute intracranial abnormality. ?  ?Haskel Schroeder, PA-C ?12/13/21 1445 ? ?

## 2021-12-13 NOTE — ED Notes (Signed)
ED Tech attempted to ambulate patient with walker. Per husband he does not want patient to attempt to ambulate and would like to speak with social work at this time due to not wanting to patient to be discharged home.  ?

## 2021-12-13 NOTE — ED Provider Notes (Signed)
?[facility] EMERGENCY DEPARTMENT ?Provider Note ? ? ?CSN: [id number] ?Arrival date & time: 12/13/21  1420 ? ?  ? ?History ? ?Chief Complaint  ?Patient presents with  ? Dizziness  ? ? ?Gabriela Middleton is a 88 y.o. female. ? ?Patient with history of hypothyroidism, hypertension, and high cholesterol presents today with complaints of dizziness. States that same has been ongoing and persistent in nature since 5/2. She states that she is only dizzy when she is standing and states that it feels as though the room is spinning. She states that she normally walks with a walker at baseline but 'sometimes forgets she needs it.' Of note, patient lives at home alone and has a son that checks in on her intermittently. States that she was seen here for same on 5/2 and had CT imaging of the head and MRI as well which were unremarkable. She was then discharged with meclizine to take at home for vertigo. Son states that she has been taking it persistently without improvement. Son also states that there was some concern that she had a cerumen impaction which could be contributing to her symptoms which was not disimpacted at last visit. She denies any headaches, neck pain, fevers, chills, weakness, numbness/tingling, visual changes, chest pain, shortness of breath. No history of similar symptoms. ? ?The history is provided by the patient. No language interpreter was used.  ?Dizziness ? ?  ? ?Home Medications ?Prior to Admission medications   ?Medication Sig Start Date End Date Taking? Authorizing Provider  ?atorvastatin (LIPITOR) 20 MG tablet Take 20 mg by mouth daily. 09/04/21   [provider]  ?diphenhydrAMINE (BENADRYL) 25 MG tablet Take 50 mg by mouth at bedtime.    [provider]  ?irbesartan-hydrochlorothiazide (AVALIDE) 300-12.5 MG tablet Take 1 tablet by mouth daily. 11/11/21   [provider]  ?levothyroxine (SYNTHROID) 88 MCG tablet Take 88 mcg by mouth daily. 09/30/21   [provider]  ?meclizine (ANTIVERT) 25 MG tablet Take 1 tablet (25 mg total) by mouth 3 (three) times daily as needed for dizziness. 12/08/21   Zadie Rhine, MD  ?   ? ?Allergies    ?Patient has no known allergies.   ? ?Review of Systems   ?Review of Systems  ?Neurological:  Positive for dizziness.  ?All other systems reviewed and are negative. ? ?Physical Exam ?Updated Vital Signs ?BP (!) 148/72   Pulse 61   Temp 98.4 ?F (36.9 ?C) (Oral)   Resp 17   Ht 5\' 2"  (1.575 m)   SpO2 98%  ?Physical Exam ?Vitals and nursing note reviewed.  ?Constitutional:   ?   General: She is not in acute distress. ?   Appearance: Normal appearance. She is normal weight. She is not ill-appearing, toxic-appearing or diaphoretic.  ?   Comments: Patient resting comfortably in bed in no acute distress  ?HENT:  ?   Head: Normocephalic and atraumatic.  ?Eyes:  ?   Pupils: Pupils are equal, round, and reactive to light.  ?Cardiovascular:  ?   Rate and Rhythm: Normal rate and regular rhythm.  ?   Heart sounds: Normal heart sounds.  ?Pulmonary:  ?   Effort: Pulmonary effort is normal. No respiratory distress.  ?   Breath sounds: Normal breath sounds.  ?Abdominal:  ?   General: Abdomen is flat.  ?   Palpations: Abdomen is soft.  ?Musculoskeletal:     ?   General: Normal range of motion.  ?   Cervical  back: Normal range of motion.  ?Skin: ?   General: Skin is warm and dry.  ?Neurological:  ?   General: No focal deficit present.  ?   Mental Status: She is alert and oriented to person, place, and time.  ?   GCS: GCS eye subscore is 4. GCS verbal subscore is 5. GCS motor subscore is 6.  ?   Sensory: Sensation is intact.  ?   Motor: Motor function is intact.  ?   Coordination: Coordination is intact.  ?   Gait: Gait is intact.  ?   Comments: Alert and oriented to self, place, time and event.  ?  ?Speech is fluent, clear without dysarthria or dysphasia.  ?  ?Strength 5/5 in upper/lower extremities   ?Sensation intact in upper/lower extremities  ?   ?CN I not tested  ?CN II grossly intact visual fields bilaterally. Did not visualize posterior eye.  ?CN III, IV, VI PERRLA and EOMs intact bilaterally  ?CN V Intact sensation to sharp and light touch to the face  ?CN VII facial movements symmetric  ?CN VIII not tested  ?CN IX, X no uvula deviation, symmetric rise of soft palate  ?CN XI 5/5 SCM and trapezius strength bilaterally  ?CN XII Midline tongue protrusion, symmetric L/R movements   ?Psychiatric:     ?   Mood and Affect: Mood normal.     ?   Behavior: Behavior normal.  ? ? ?ED Results / Procedures / Treatments   ?Labs ?(all labs ordered are listed, but only abnormal results are displayed) ?Labs Reviewed  ?COMPREHENSIVE METABOLIC PANEL - Abnormal; Notable for the following components:  ?    Result Value  ? Creatinine, Ser 1.33 (*)   ? GFR, Estimated 37 (*)   ? All other components within normal limits  ?CBC WITH DIFFERENTIAL/PLATELET - Abnormal; Notable for the following components:  ? Hemoglobin 11.6 (*)   ? HCT 34.7 (*)   ? All other components within normal limits  ?TROPONIN I (HIGH SENSITIVITY) - Abnormal; Notable for the following components:  ? Troponin I (High Sensitivity) 18 (*)   ? All other components within normal limits  ?TROPONIN I (HIGH SENSITIVITY) - Abnormal; Notable for the following components:  ? Troponin I (High Sensitivity) 20 (*)   ? All other components within normal limits  ? ? ?EKG ?None ? ?Radiology ?No results found. ? ?Procedures ?Procedures  ? ? ?Medications Ordered in ED ?Medications - No data to display ? ?ED Course/ Medical Decision Making/ A&P ?  ?                        ?Medical Decision Making ? ?This patient presents to the ED for concern of dizziness, this involves an extensive number of treatment options, and is a complaint that carries with it a high risk of complications and morbidity. ? ? ?Co morbidities that complicate the patient evaluation ? ?Htn, high cholesterol ? ? ?Additional history obtained: ? ?Additional  history obtained from previous ER note and MRI performed at previous visit ? ? ?Lab Tests: ? ?I Ordered, and personally interpreted labs.  The pertinent results include:  Hemoglobin 11.6 per baseline. Troponin 18 --> 20 ? ? ?Cardiac Monitoring: / EKG: ? ?The patient was maintained on a cardiac monitor.  I personally viewed and interpreted the cardiac monitored which showed an underlying rhythm of: no STEMI, unchanged from baseline ? ?Patient presents today with persistent dizziness since she was seen  previously on 5/2. She is afebrile, non-toxic appearing, and in no acute distress with reassuring vital signs. She is also alert and oriented x3 and neurologically intact. Given that her symptoms are unchanged from her previous visit on 5/2 when she had an MRI brain, no repeat imaging is indicated. She is also able to ambulate with her walker per baseline without endorsing any dizziness. She did receive bilateral ear cleaning from cerumen impaction which could've potentially been contributing to her symptoms. Patient was also seen by social work in the ER today who established that the patient already has appropriate resources in place to manage her symptoms and rehab at home. Patient did have some troponin elevation, however is mostly unchanged from her baseline and she declines any chest pain, therefore no further concerns regarding this finding. She is stable for discharge at this time. Educated on red flag symptoms that would prompt immediate return. Discharged in stable condition. ? ? ?This is a shared visit with supervising physician Dr. Lynelle DoctorKnapp who has independently evaluated patient & provided guidance in evaluation/management/disposition, in agreement with care  ? ? ?Final Clinical Impression(s) / ED Diagnoses ?Final diagnoses:  ?Dizziness  ? ? ?Rx / DC Orders ?ED Discharge Orders   ? ? None  ? ?  ?An After Visit Summary was printed and given to the patient. ? ? ?  ?Silva BandySmoot, Malu Pellegrini A, PA-C ?12/13/21 2203 ? ?   ?Linwood DibblesKnapp, Jon, MD ?12/16/21 1314 ? ?

## 2021-12-13 NOTE — Progress Notes (Signed)
Transition of Care [facility]) - Emergency Department Mini Assessment ? ? ?Patient Details  ?Name: Gabriela Middleton ?MRN: [id number] ?Date of Birth: 06/05/1924 ? ?Transition of Care (TOC) CM/SW Contact:    ?Ciin Brazzel, LCSW ?Phone Number: ?12/13/2021, 10:23 PM ? ? ?Clinical Narrative: ? ? ? ?ED Mini Assessment: ?What brought you to the Emergency Department? : (P) dizzyness ? ?Barriers to Discharge: (P) No Barriers Identified ? ?Barrier interventions: (P) TOC ED team met with Pt and son, Jenny Reichmann at bedside. Son voiced concerns about Pt's dizzy spells upon d/c. Team explained that providers decide on appropraite d/c based on medical exam , but that we would be able to coordinate [facility] are if required. Son states that [facility] is already in place. Son reports that he wil be spending more time with Pt at Pt's home. Voiced understannding of d/c process. ? ?Means of departure: (P) Not know ? ?Interventions which prevented an admission or readmission: (P) Other (must enter comment) ? ? ? ?Patient Contact and Communications ?  ?  ?  ? ,     ?  ?  ? ?  ?  ?  ? ?Admission diagnosis:  Dizziness ?There are no problems to display for this patient. ? ?PCP:  Pcp, No ?Pharmacy:   ?[facility] ?[address] ?[place] [address] ?Phone: [phone] Fax: [phone] ?  ?

## 2021-12-13 NOTE — ED Notes (Signed)
ED Provider at bedside. 

## 2021-12-13 NOTE — ED Triage Notes (Signed)
Pt arrives via EMS from home with dizziness. Seen at [facility] last week for same and diagnosed with vertigo. Family is requesting her ears to be cleaned out. Pt denies dizziness unless standing.  ?

## 2021-12-13 NOTE — ED Notes (Signed)
This RN and ED Tech were able to ambulate patient with walker. Patient tolerated well. No complaints of dizziness while ambulating. Provider made aware of same.  ?

## 2021-12-13 NOTE — Discharge Instructions (Signed)
As we discussed, your work-up in the ER today was reassuring for acute abnormalities.  I recommend that you continue to participate in physical therapy at home with hopes that this will improve your symptoms.  We did perform earwax removal in the ER today which could potentially give you some relief of your dizziness as well.  Follow-up with your primary care doctor in the next few days for continued evaluation and management of your symptoms. ? ?Return if development of any new or worsening symptoms. ?

## 2021-12-29 DIAGNOSIS — H6123 Impacted cerumen, bilateral: Secondary | ICD-10-CM | POA: Diagnosis not present

## 2022-01-05 DIAGNOSIS — N1831 Chronic kidney disease, stage 3a: Secondary | ICD-10-CM | POA: Diagnosis not present

## 2022-01-05 DIAGNOSIS — E7800 Pure hypercholesterolemia, unspecified: Secondary | ICD-10-CM | POA: Diagnosis not present

## 2022-01-05 DIAGNOSIS — Z0000 Encounter for general adult medical examination without abnormal findings: Secondary | ICD-10-CM | POA: Diagnosis not present

## 2022-01-05 DIAGNOSIS — R42 Dizziness and giddiness: Secondary | ICD-10-CM | POA: Diagnosis not present

## 2022-01-09 DIAGNOSIS — I10 Essential (primary) hypertension: Secondary | ICD-10-CM | POA: Diagnosis not present

## 2022-01-09 DIAGNOSIS — E7800 Pure hypercholesterolemia, unspecified: Secondary | ICD-10-CM | POA: Diagnosis not present

## 2022-01-09 DIAGNOSIS — H814 Vertigo of central origin: Secondary | ICD-10-CM | POA: Diagnosis not present

## 2022-01-09 DIAGNOSIS — Z9181 History of falling: Secondary | ICD-10-CM | POA: Diagnosis not present

## 2022-01-09 DIAGNOSIS — E039 Hypothyroidism, unspecified: Secondary | ICD-10-CM | POA: Diagnosis not present

## 2022-01-09 DIAGNOSIS — M6281 Muscle weakness (generalized): Secondary | ICD-10-CM | POA: Diagnosis not present

## 2022-01-09 DIAGNOSIS — Z79899 Other long term (current) drug therapy: Secondary | ICD-10-CM | POA: Diagnosis not present

## 2022-01-10 DIAGNOSIS — E039 Hypothyroidism, unspecified: Secondary | ICD-10-CM | POA: Diagnosis not present

## 2022-01-10 DIAGNOSIS — I10 Essential (primary) hypertension: Secondary | ICD-10-CM | POA: Diagnosis not present

## 2022-01-10 DIAGNOSIS — H814 Vertigo of central origin: Secondary | ICD-10-CM | POA: Diagnosis not present

## 2022-01-19 DIAGNOSIS — W1830XA Fall on same level, unspecified, initial encounter: Secondary | ICD-10-CM | POA: Diagnosis not present

## 2022-01-19 DIAGNOSIS — M79675 Pain in left toe(s): Secondary | ICD-10-CM | POA: Diagnosis not present

## 2022-01-19 DIAGNOSIS — S99922A Unspecified injury of left foot, initial encounter: Secondary | ICD-10-CM | POA: Diagnosis not present

## 2022-02-08 DIAGNOSIS — M6281 Muscle weakness (generalized): Secondary | ICD-10-CM | POA: Diagnosis not present

## 2022-02-08 DIAGNOSIS — E7800 Pure hypercholesterolemia, unspecified: Secondary | ICD-10-CM | POA: Diagnosis not present

## 2022-02-08 DIAGNOSIS — I10 Essential (primary) hypertension: Secondary | ICD-10-CM | POA: Diagnosis not present

## 2022-02-08 DIAGNOSIS — Z79899 Other long term (current) drug therapy: Secondary | ICD-10-CM | POA: Diagnosis not present

## 2022-02-08 DIAGNOSIS — Z9181 History of falling: Secondary | ICD-10-CM | POA: Diagnosis not present

## 2022-02-08 DIAGNOSIS — E039 Hypothyroidism, unspecified: Secondary | ICD-10-CM | POA: Diagnosis not present

## 2022-02-08 DIAGNOSIS — H814 Vertigo of central origin: Secondary | ICD-10-CM | POA: Diagnosis not present

## 2022-02-28 DIAGNOSIS — I10 Essential (primary) hypertension: Secondary | ICD-10-CM | POA: Diagnosis not present

## 2022-02-28 DIAGNOSIS — H814 Vertigo of central origin: Secondary | ICD-10-CM | POA: Diagnosis not present

## 2022-02-28 DIAGNOSIS — E039 Hypothyroidism, unspecified: Secondary | ICD-10-CM | POA: Diagnosis not present

## 2022-02-28 DIAGNOSIS — M6281 Muscle weakness (generalized): Secondary | ICD-10-CM | POA: Diagnosis not present

## 2022-04-17 DIAGNOSIS — M542 Cervicalgia: Secondary | ICD-10-CM | POA: Diagnosis not present

## 2022-04-17 DIAGNOSIS — Z20822 Contact with and (suspected) exposure to covid-19: Secondary | ICD-10-CM | POA: Diagnosis not present

## 2022-04-17 DIAGNOSIS — J029 Acute pharyngitis, unspecified: Secondary | ICD-10-CM | POA: Diagnosis not present

## 2022-04-21 DIAGNOSIS — R1310 Dysphagia, unspecified: Secondary | ICD-10-CM | POA: Diagnosis not present

## 2022-04-22 ENCOUNTER — Encounter (HOSPITAL_COMMUNITY): Payer: Self-pay

## 2022-04-22 ENCOUNTER — Emergency Department (HOSPITAL_COMMUNITY)
Admission: EM | Admit: 2022-04-22 | Discharge: 2022-04-22 | Discharge status: Against Medical Advice | Payer: Medicare Other | Attending: Emergency Medicine | Admitting: Emergency Medicine

## 2022-04-22 ENCOUNTER — Other Ambulatory Visit: Payer: Self-pay

## 2022-04-22 DIAGNOSIS — I10 Essential (primary) hypertension: Secondary | ICD-10-CM | POA: Diagnosis not present

## 2022-04-22 DIAGNOSIS — R0602 Shortness of breath: Secondary | ICD-10-CM | POA: Diagnosis not present

## 2022-04-22 DIAGNOSIS — Z5321 Procedure and treatment not carried out due to patient leaving prior to being seen by health care provider: Secondary | ICD-10-CM | POA: Insufficient documentation

## 2022-04-22 DIAGNOSIS — R0689 Other abnormalities of breathing: Secondary | ICD-10-CM | POA: Diagnosis not present

## 2022-04-22 NOTE — ED Triage Notes (Signed)
Per EMS- Patient c/o SOB today.  Patient's son reports that the patient was seen at an ED last week for SOB and when discharged she was to schedule herself for an endoscopy, which she has not done yet.

## 2022-04-25 DIAGNOSIS — Z888 Allergy status to other drugs, medicaments and biological substances status: Secondary | ICD-10-CM | POA: Diagnosis not present

## 2022-04-25 DIAGNOSIS — R0602 Shortness of breath: Secondary | ICD-10-CM | POA: Diagnosis not present

## 2022-04-25 DIAGNOSIS — R1310 Dysphagia, unspecified: Secondary | ICD-10-CM | POA: Diagnosis not present

## 2022-04-25 DIAGNOSIS — K449 Diaphragmatic hernia without obstruction or gangrene: Secondary | ICD-10-CM | POA: Diagnosis not present

## 2022-04-25 DIAGNOSIS — Z79899 Other long term (current) drug therapy: Secondary | ICD-10-CM | POA: Diagnosis not present

## 2022-04-25 DIAGNOSIS — I4510 Unspecified right bundle-branch block: Secondary | ICD-10-CM | POA: Diagnosis not present

## 2022-04-25 DIAGNOSIS — Z79890 Hormone replacement therapy (postmenopausal): Secondary | ICD-10-CM | POA: Diagnosis not present

## 2022-04-28 DIAGNOSIS — R1310 Dysphagia, unspecified: Secondary | ICD-10-CM | POA: Diagnosis not present

## 2022-04-28 DIAGNOSIS — Q399 Congenital malformation of esophagus, unspecified: Secondary | ICD-10-CM | POA: Diagnosis not present

## 2022-04-29 ENCOUNTER — Emergency Department (HOSPITAL_COMMUNITY): Payer: Medicare Other

## 2022-04-29 ENCOUNTER — Encounter (HOSPITAL_COMMUNITY): Payer: Self-pay

## 2022-04-29 ENCOUNTER — Emergency Department (HOSPITAL_COMMUNITY)
Admission: EM | Admit: 2022-04-29 | Discharge: 2022-04-30 | Disposition: A | Discharge status: Home / Self Care | Payer: Medicare Other | Attending: Emergency Medicine | Admitting: Emergency Medicine

## 2022-04-29 DIAGNOSIS — R0602 Shortness of breath: Secondary | ICD-10-CM

## 2022-04-29 DIAGNOSIS — I77819 Aortic ectasia, unspecified site: Secondary | ICD-10-CM | POA: Diagnosis not present

## 2022-04-29 DIAGNOSIS — F0390 Unspecified dementia without behavioral disturbance: Secondary | ICD-10-CM | POA: Insufficient documentation

## 2022-04-29 DIAGNOSIS — Z9181 History of falling: Secondary | ICD-10-CM | POA: Insufficient documentation

## 2022-04-29 DIAGNOSIS — W19XXXA Unspecified fall, initial encounter: Secondary | ICD-10-CM | POA: Diagnosis not present

## 2022-04-29 DIAGNOSIS — K449 Diaphragmatic hernia without obstruction or gangrene: Secondary | ICD-10-CM | POA: Diagnosis not present

## 2022-04-29 DIAGNOSIS — I672 Cerebral atherosclerosis: Secondary | ICD-10-CM | POA: Diagnosis not present

## 2022-04-29 DIAGNOSIS — M25521 Pain in right elbow: Secondary | ICD-10-CM | POA: Diagnosis not present

## 2022-04-29 DIAGNOSIS — M6281 Muscle weakness (generalized): Secondary | ICD-10-CM | POA: Diagnosis not present

## 2022-04-29 DIAGNOSIS — R109 Unspecified abdominal pain: Secondary | ICD-10-CM | POA: Insufficient documentation

## 2022-04-29 DIAGNOSIS — I6529 Occlusion and stenosis of unspecified carotid artery: Secondary | ICD-10-CM | POA: Diagnosis not present

## 2022-04-29 DIAGNOSIS — I10 Essential (primary) hypertension: Secondary | ICD-10-CM | POA: Diagnosis not present

## 2022-04-29 DIAGNOSIS — S0990XA Unspecified injury of head, initial encounter: Secondary | ICD-10-CM | POA: Diagnosis not present

## 2022-04-29 DIAGNOSIS — I700 Atherosclerosis of aorta: Secondary | ICD-10-CM | POA: Diagnosis not present

## 2022-04-29 HISTORY — DX: Unspecified dementia, unspecified severity, without behavioral disturbance, psychotic disturbance, mood disturbance, and anxiety: F03.90

## 2022-04-29 LAB — CBC
HCT: 38 % (ref 36.0–46.0)
Hemoglobin: 12.7 g/dL (ref 12.0–15.0)
MCH: 28.9 pg (ref 26.0–34.0)
MCHC: 33.4 g/dL (ref 30.0–36.0)
MCV: 86.4 fL (ref 80.0–100.0)
Platelets: 193 10*3/uL (ref 150–400)
RBC: 4.4 MIL/uL (ref 3.87–5.11)
RDW: 12.1 % (ref 11.5–15.5)
WBC: 6.9 10*3/uL (ref 4.0–10.5)
nRBC: 0 % (ref 0.0–0.2)

## 2022-04-29 LAB — HEPATIC FUNCTION PANEL
ALT: 15 U/L (ref 0–44)
AST: 22 U/L (ref 15–41)
Albumin: 4.1 g/dL (ref 3.5–5.0)
Alkaline Phosphatase: 75 U/L (ref 38–126)
Bilirubin, Direct: 0.1 mg/dL (ref 0.0–0.2)
Indirect Bilirubin: 0.8 mg/dL (ref 0.3–0.9)
Total Bilirubin: 0.9 mg/dL (ref 0.3–1.2)
Total Protein: 8.3 g/dL — ABNORMAL HIGH (ref 6.5–8.1)

## 2022-04-29 LAB — URINALYSIS, ROUTINE W REFLEX MICROSCOPIC
Bacteria, UA: NONE SEEN
Bilirubin Urine: NEGATIVE
Glucose, UA: NEGATIVE mg/dL
Ketones, ur: 5 mg/dL — AB
Leukocytes,Ua: NEGATIVE
Nitrite: NEGATIVE
Protein, ur: NEGATIVE mg/dL
Specific Gravity, Urine: 1.014 (ref 1.005–1.030)
pH: 6 (ref 5.0–8.0)

## 2022-04-29 LAB — BASIC METABOLIC PANEL
Anion gap: 9 (ref 5–15)
BUN: 17 mg/dL (ref 8–23)
CO2: 23 mmol/L (ref 22–32)
Calcium: 9.3 mg/dL (ref 8.9–10.3)
Chloride: 98 mmol/L (ref 98–111)
Creatinine, Ser: 1.13 mg/dL — ABNORMAL HIGH (ref 0.44–1.00)
GFR, Estimated: 45 mL/min — ABNORMAL LOW (ref >60)
Glucose, Bld: 103 mg/dL — ABNORMAL HIGH (ref 70–99)
Potassium: 4.1 mmol/L (ref 3.5–5.1)
Sodium: 130 mmol/L — ABNORMAL LOW (ref 135–145)

## 2022-04-29 LAB — TROPONIN I (HIGH SENSITIVITY)
Troponin I (High Sensitivity): 22 ng/L — ABNORMAL HIGH (ref <18)
Troponin I (High Sensitivity): 27 ng/L — ABNORMAL HIGH (ref <18)

## 2022-04-29 MED ORDER — SODIUM CHLORIDE 0.9 % IV BOLUS
1000.0000 mL | Freq: Once | INTRAVENOUS | Status: AC
  Administered 2022-04-29: 1000 mL via INTRAVENOUS

## 2022-04-29 MED ORDER — DIPHENHYDRAMINE HCL 50 MG/ML IJ SOLN
12.5000 mg | Freq: Once | INTRAMUSCULAR | Status: AC
  Administered 2022-04-29: 12.5 mg via INTRAVENOUS
  Filled 2022-04-29: qty 1

## 2022-04-29 MED ORDER — ALBUTEROL SULFATE HFA 108 (90 BASE) MCG/ACT IN AERS
2.0000 | INHALATION_SPRAY | RESPIRATORY_TRACT | Status: DC | PRN
  Filled 2022-04-29: qty 6.7

## 2022-04-29 MED ORDER — HALOPERIDOL LACTATE 5 MG/ML IJ SOLN
1.0000 mg | Freq: Once | INTRAMUSCULAR | Status: AC
  Administered 2022-04-29: 1 mg via INTRAVENOUS
  Filled 2022-04-29: qty 1

## 2022-04-29 MED ORDER — ALUM & MAG HYDROXIDE-SIMETH 200-200-20 MG/5ML PO SUSP
30.0000 mL | Freq: Once | ORAL | Status: AC
Start: 2022-04-29 — End: 2022-04-29
  Administered 2022-04-29: 30 mL via ORAL
  Filled 2022-04-29: qty 30

## 2022-04-29 MED ORDER — HALOPERIDOL LACTATE 5 MG/ML IJ SOLN
2.0000 mg | Freq: Once | INTRAMUSCULAR | Status: AC
  Administered 2022-04-29: 2 mg via INTRAVENOUS
  Filled 2022-04-29: qty 1

## 2022-04-29 MED ORDER — IOHEXOL 350 MG/ML SOLN
75.0000 mL | Freq: Once | INTRAVENOUS | Status: AC | PRN
  Administered 2022-04-29: 75 mL via INTRAVENOUS

## 2022-04-29 NOTE — ED Notes (Signed)
Unable to obtain urine with in and out cath. MD notified.

## 2022-04-29 NOTE — ED Provider Triage Note (Signed)
Emergency Medicine Provider Triage Evaluation Note  Gabriela Middleton , a 88 y.o. female  was evaluated in triage.  Pt complains of sob. Pt report feeling sob since yesterday after she had an endoscopy.  Triage note mentioned abd pain, pt declined.  Hx of dementia.  Skin tear to R elbow.  Denies cp, or abd pain  Review of Systems  Positive: As above Negative: As above  Physical Exam  BP (!) 159/89 (BP Location: Left Arm)   Pulse 66   Temp 98.4 F (36.9 C) (Oral)   Resp 20   SpO2 98%  Gen:   Awake, no distress   Resp:  Normal effort  MSK:   Moves extremities without difficulty  Other:    Medical Decision Making  Medically screening exam initiated at 12:31 PM.  Appropriate orders placed.  ZAIN BINGMAN was informed that the remainder of the evaluation will be completed by another provider, this initial triage assessment does not replace that evaluation, and the importance of remaining in the ED until their evaluation is complete.     Gabriela Moras, PA-C 04/29/22 1240

## 2022-04-29 NOTE — ED Triage Notes (Addendum)
Per EMS- Patient c/o SOB, fall, and abdominal pain. EMS states that she had an endoscopy yesterday and fell at home afterwards. Patient has a skin tear to the right elbow. EMS also reports that the patient grabbed her abdomen as if in pain. Patient has a history of dementia.   EMS placed O2 @l /min via [place] for comfort only.

## 2022-04-29 NOTE — ED Provider Notes (Signed)
[facility]-EMERGENCY DEPT Provider Note   CSN: [id number] Arrival date & time: 04/29/22  1159     History  Chief Complaint  Patient presents with   Shortness of Breath   Fall   Abdominal Pain    Gabriela Middleton is a 88 y.o. female.  88 yo F with a chief complaints of shortness of breath.  The patient is severely demented.  Has been complaining of difficulty breathing for at least the past week or 2.  Has been seen by the GI doctors for this and thought to be due to her hiatal hernia and esophageal stricture.  She had an EGD about 24 hours ago to try and improve the scenario.  This did not fix her problem and she continues to have the same complaint.  The family is concerned that they can no longer take care of her and brought her here for evaluation and possible placement or admission.   Shortness of Breath Associated symptoms: abdominal pain   Fall Associated symptoms include abdominal pain and shortness of breath.  Abdominal Pain Associated symptoms: shortness of breath        Home Medications Prior to Admission medications   Medication Sig Start Date End Date Taking? Authorizing Provider  atorvastatin (LIPITOR) 20 MG tablet Take 20 mg by mouth daily. 09/04/21   [provider]  diphenhydrAMINE (BENADRYL) 25 MG tablet Take 50 mg by mouth at bedtime.    [provider]  irbesartan-hydrochlorothiazide (AVALIDE) 300-12.5 MG tablet Take 1 tablet by mouth daily. 11/11/21   [provider]  levothyroxine (SYNTHROID) 88 MCG tablet Take 88 mcg by mouth daily. 09/30/21   [provider]  meclizine (ANTIVERT) 25 MG tablet Take 1 tablet (25 mg total) by mouth 3 (three) times daily as needed for dizziness. 12/08/21   Zadie RhineWickline, Donald, MD      Allergies    Patient has no known allergies.    Review of Systems   Review of Systems  Respiratory:  Positive for shortness of breath.   Gastrointestinal:  Positive for abdominal pain.     Physical Exam Updated Vital Signs BP (!) 165/75   Pulse 80   Temp 98.3 F (36.8 C)   Resp 20   SpO2 97%  Physical Exam Vitals and nursing note reviewed.  Constitutional:      General: She is not in acute distress.    Appearance: She is well-developed. She is ill-appearing (Chronically ill-appearing). She is not diaphoretic.  HENT:     Head: Normocephalic and atraumatic.  Eyes:     Pupils: Pupils are equal, round, and reactive to light.  Cardiovascular:     Rate and Rhythm: Normal rate and regular rhythm.     Heart sounds: No murmur heard.    No friction rub. No gallop.  Pulmonary:     Effort: Pulmonary effort is normal.     Breath sounds: No wheezing or rales.  Abdominal:     General: There is no distension.     Palpations: Abdomen is soft.     Tenderness: There is no abdominal tenderness.  Musculoskeletal:        General: No tenderness.     Cervical back: Normal range of motion and neck supple.  Skin:    General: Skin is warm and dry.  Neurological:     Mental Status: She is alert.  Psychiatric:        Behavior: Behavior normal.     ED Results / Procedures /  Treatments   Labs (all labs ordered are listed, but only abnormal results are displayed) Labs Reviewed  BASIC METABOLIC PANEL - Abnormal; Notable for the following components:      Result Value   Sodium 130 (*)    Glucose, Bld 103 (*)    Creatinine, Ser 1.13 (*)    GFR, Estimated 45 (*)    All other components within normal limits  URINALYSIS, ROUTINE W REFLEX MICROSCOPIC - Abnormal; Notable for the following components:   Color, Urine STRAW (*)    Hgb urine dipstick MODERATE (*)    Ketones, ur 5 (*)    All other components within normal limits  HEPATIC FUNCTION PANEL - Abnormal; Notable for the following components:   Total Protein 8.3 (*)    All other components within normal limits  TROPONIN I (HIGH SENSITIVITY) - Abnormal; Notable for the following components:   Troponin I (High Sensitivity)  22 (*)    All other components within normal limits  CBC  CBG MONITORING, ED    EKG EKG Interpretation  Date/Time:  Friday April 29 2022 12:37:08 EDT Ventricular Rate:  62 PR Interval:  158 QRS Duration: 133 QT Interval:  484 QTC Calculation: 492 R Axis:   20 Text Interpretation: Sinus rhythm Right bundle branch block Probable left ventricular hypertrophy qt manually calculated normal st depression in lateral and inferior leads seen on prior No significant change since last tracing Confirmed by Melene Plan [phone]) on 04/29/2022 4:14:33 PM  Radiology CT Angio Chest PE W and/or Wo Contrast  Result Date: 04/29/2022 CLINICAL DATA:  Shortness of breath, recent endoscopy EXAM: CT ANGIOGRAPHY CHEST WITH CONTRAST TECHNIQUE: Multidetector CT imaging of the chest was performed using the standard protocol during bolus administration of intravenous contrast. Multiplanar CT image reconstructions and MIPs were obtained to evaluate the vascular anatomy. RADIATION DOSE REDUCTION: This exam was performed according to the departmental dose-optimization program which includes automated exposure control, adjustment of the mA and/or kV according to patient size and/or use of iterative reconstruction technique. CONTRAST:  75mL OMNIPAQUE IOHEXOL 350 MG/ML SOLN COMPARISON:  Chest radiographs done earlier today FINDINGS: Cardiovascular: There is homogeneous enhancement in thoracic aorta. Ascending thoracic aorta measures 3.6 cm. Coronary artery calcifications are seen. Dense calcification is seen in mitral annulus. There are no intraluminal filling defects in central pulmonary artery branches. Evaluation of small peripheral branches is limited by motion artifacts. Mediastinum/Nodes: No significant lymphadenopathy seen. Lungs/Pleura: There are small linear densities in both lower lung fields. There is no focal pulmonary consolidation. There is no pleural effusion or pneumothorax. Upper Abdomen: There is moderate to  large fixed hiatal hernia. There is 2.1 cm cyst in the right kidney. Musculoskeletal: There is marked decrease in height of body of T12 vertebra with previous vertebroplasty. Review of the MIP images confirms the above findings. IMPRESSION: There is no evidence of central pulmonary artery embolism. Evaluation of small peripheral branches is limited by motion artifacts. Coronary artery calcifications are seen. There is dense calcification in mitral annulus. There is no evidence of thoracic aortic dissection. There is ectasia of ascending thoracic aorta measuring 3.6 cm. Recommend annual imaging followup by CTA or MRA. This recommendation follows 2010 ACCF/AHA/AATS/ACR/ASA/SCA/SCAI/SIR/STS/SVM Guidelines for the Diagnosis and Management of Patients with Thoracic Aortic Disease. Circulation. 2010; 121: W098-J191. Aortic aneurysm NOS (ICD10-I71.9) Small linear densities in the lower lung fields suggest scarring. There is no focal pulmonary consolidation. There is no pleural effusion or pneumothorax. Moderate to large fixed hiatal hernia.  Right renal cyst.  Electronically Signed   By: Elmer Picker M.D.   On: 04/29/2022 18:36   CT ABDOMEN PELVIS W CONTRAST  Result Date: 04/29/2022 CLINICAL DATA:  Abdominal pain beginning yesterday after endoscopy. EXAM: CT ABDOMEN AND PELVIS WITH CONTRAST TECHNIQUE: Multidetector CT imaging of the abdomen and pelvis was performed using the standard protocol following bolus administration of intravenous contrast. RADIATION DOSE REDUCTION: This exam was performed according to the departmental dose-optimization program which includes automated exposure control, adjustment of the mA and/or kV according to patient size and/or use of iterative reconstruction technique. CONTRAST:  70mL OMNIPAQUE IOHEXOL 350 MG/ML SOLN COMPARISON:  None Available. FINDINGS: Lower Chest: No acute findings. Hepatobiliary: No hepatic masses identified. Prior cholecystectomy. No evidence of biliary  obstruction. Pancreas:  No mass or inflammatory changes. Spleen: Within normal limits in size and appearance. Adrenals/Urinary Tract: No suspicious masses identified. No evidence of ureteral calculi or hydronephrosis. Stomach/Bowel: A large hiatal hernia is seen. No evidence of obstruction, inflammatory process or abnormal fluid collections. Diverticulosis is seen mainly involving the sigmoid colon, however there is no evidence of diverticulitis. No evidence of intraperitoneal air. Vascular/Lymphatic: No pathologically enlarged lymph nodes. No acute vascular findings. Aortic atherosclerotic calcification incidentally noted. Reproductive:  No mass or other significant abnormality. Other:  None. Musculoskeletal: No suspicious bone lesions identified. Old T12 vertebral body compression fracture with vertebroplasty noted. IMPRESSION: No acute findings within the abdomen or pelvis. Large hiatal hernia. Colonic diverticulosis, without radiographic evidence of diverticulitis. Aortic Atherosclerosis (ICD10-I70.0). Electronically Signed   By: Marlaine Hind M.D.   On: 04/29/2022 18:30   CT HEAD WO CONTRAST  Result Date: 04/29/2022 CLINICAL DATA:  Head trauma EXAM: CT HEAD WITHOUT CONTRAST TECHNIQUE: Contiguous axial images were obtained from the base of the skull through the vertex without intravenous contrast. RADIATION DOSE REDUCTION: This exam was performed according to the departmental dose-optimization program which includes automated exposure control, adjustment of the mA and/or kV according to patient size and/or use of iterative reconstruction technique. COMPARISON:  Head CT 12/07/2021 FINDINGS: Brain: No acute territorial infarction, hemorrhage, or intracranial mass. Atrophy and chronic small vessel ischemic changes of the white matter. Stable ventricle size Vascular: No hyperdense vessels. Carotid and vertebral calcification. Skull: Normal. Negative for fracture or focal lesion. Sinuses/Orbits: No acute finding.  Other: None IMPRESSION: 1. No CT evidence for acute intracranial abnormality. 2. Atrophy and chronic small vessel ischemic changes of the white matter Electronically Signed   By: Donavan Foil M.D.   On: 04/29/2022 18:30   DG Chest 2 View  Result Date: 04/29/2022 CLINICAL DATA:  Shortness of breath beginning yesterday. EXAM: CHEST - 2 VIEW COMPARISON:  None Available. FINDINGS: The heart size is within normal limits. Aortic atherosclerotic calcification incidentally noted. A small to moderate hiatal hernia is seen. Mild linear opacities in both lung bases may be due to atelectasis or scarring. No evidence of pulmonary airspace disease or edema. No evidence of pleural effusion. An old compression fracture of a lower thoracic vertebral body is seen, with previous kyphoplasty. IMPRESSION: Mild bibasilar atelectasis versus scarring. Small to moderate hiatal hernia. Electronically Signed   By: Marlaine Hind M.D.   On: 04/29/2022 13:44   DG Elbow Complete Right  Result Date: 04/29/2022 CLINICAL DATA:  Right elbow pain after fall. EXAM: RIGHT ELBOW - COMPLETE 3+ VIEW COMPARISON:  None Available. FINDINGS: There is no evidence of fracture, dislocation, or joint effusion. There is no evidence of arthropathy or other focal bone abnormality. Soft tissues are unremarkable. IMPRESSION: Negative.  Electronically Signed   By: Lupita Raider M.D.   On: 04/29/2022 13:41    Procedures BLADDER CATHETERIZATION  Date/Time: 04/29/2022 10:03 PM  Performed by: Melene Plan, DO Authorized by: Melene Plan, DO   Consent:    Consent obtained:  Verbal   Consent given by:  Patient and guardian   Risks, benefits, and alternatives were discussed: yes     Risks discussed:  False passage, infection, urethral injury, incomplete procedure and pain   Alternatives discussed:  No treatment, delayed treatment and alternative treatment Universal protocol:    Immediately prior to procedure, a time out was called: yes     Patient identity  confirmed:  Verbally with patient Pre-procedure details:    Procedure purpose:  Therapeutic   Preparation: Patient was prepped and draped in usual sterile fashion   Anesthesia:    Anesthesia method:  None Procedure details:    Provider performed due to:  Altered anatomy, complicated insertion and nurse unable to complete   Catheter insertion:  Non-indwelling   Catheter type:  Foley   Catheter size:  12 Fr   Number of attempts:  3   Urine characteristics:  Clear Post-procedure details:    Procedure completion:  Tolerated well, no immediate complications     Medications Ordered in ED Medications  albuterol (VENTOLIN HFA) 108 (90 Base) MCG/ACT inhaler 2 puff (has no administration in time range)  alum & mag hydroxide-simeth (MAALOX/MYLANTA) 200-200-20 MG/5ML suspension 30 mL (30 mLs Oral Given 04/29/22 1656)  haloperidol lactate (HALDOL) injection 1 mg (1 mg Intravenous Given 04/29/22 1735)  iohexol (OMNIPAQUE) 350 MG/ML injection 75 mL (75 mLs Intravenous Contrast Given 04/29/22 1807)  haloperidol lactate (HALDOL) injection 2 mg (2 mg Intravenous Given 04/29/22 1859)  diphenhydrAMINE (BENADRYL) injection 12.5 mg (12.5 mg Intravenous Given 04/29/22 1858)  sodium chloride 0.9 % bolus 1,000 mL (0 mLs Intravenous Stopped 04/29/22 2039)  sodium chloride 0.9 % bolus 1,000 mL (0 mLs Intravenous Stopped 04/29/22 2148)    ED Course/ Medical Decision Making/ A&P                           Medical Decision Making Amount and/or Complexity of Data Reviewed Labs: ordered. Radiology: ordered.  Risk OTC drugs. Prescription drug management.   88 yo F with a chief complaint of difficulty breathing.  Patient is severely demented has been yelling this out off and on for the past week or 2.  Troponin appears to be at baseline.  No significant anemia no significant electrolyte abnormality.  CT scan of the chest abdomen pelvis without acute pathology.  Awaiting UA.  Nursing having difficulty getting in  and out cath.  Performed by myself.  Patient's UA also is negative for acute infection.  I discussed results with the patient and family.  Patient's family is upset and demands that they be admitted to the hospital.  I had a long discussion with them about how I did not find an obvious reason for admission.  Patient continues to be pleasantly demented.  Negative work-up.  Family is upset because social work does not talk with them.  They are upset that they are not being offered a skilled nursing facility immediately.  I discussed with them that it would be better to take the patient home and to have this process done as an outpatient.  They demand to stay until they are seen by a Child psychotherapist.  We will have them board in  the ED overnight.  The patients results and plan were reviewed and discussed.   Any x-rays performed were independently reviewed by myself.   Differential diagnosis were considered with the presenting HPI.  Medications  albuterol (VENTOLIN HFA) 108 (90 Base) MCG/ACT inhaler 2 puff (has no administration in time range)  alum & mag hydroxide-simeth (MAALOX/MYLANTA) 200-200-20 MG/5ML suspension 30 mL (30 mLs Oral Given 04/29/22 1656)  haloperidol lactate (HALDOL) injection 1 mg (1 mg Intravenous Given 04/29/22 1735)  iohexol (OMNIPAQUE) 350 MG/ML injection 75 mL (75 mLs Intravenous Contrast Given 04/29/22 1807)  haloperidol lactate (HALDOL) injection 2 mg (2 mg Intravenous Given 04/29/22 1859)  diphenhydrAMINE (BENADRYL) injection 12.5 mg (12.5 mg Intravenous Given 04/29/22 1858)  sodium chloride 0.9 % bolus 1,000 mL (0 mLs Intravenous Stopped 04/29/22 2039)  sodium chloride 0.9 % bolus 1,000 mL (0 mLs Intravenous Stopped 04/29/22 2148)    Vitals:   04/29/22 2030 04/29/22 2039 04/29/22 2100 04/29/22 2200  BP: (!) 166/71  (!) 149/73 (!) 165/75  Pulse: 65  74 80  Resp: 14  12 20   Temp:  98.3 F (36.8 C)    TempSrc:      SpO2: 97%  97% 97%    Final diagnoses:  Shortness of  breath            Final Clinical Impression(s) / ED Diagnoses Final diagnoses:  Shortness of breath    Rx / DC Orders ED Discharge Orders     None         , DO 04/29/22 2241

## 2022-04-29 NOTE — ED Notes (Signed)
Unable to fully triage the patient due to patient having dementia.

## 2022-04-29 NOTE — ED Notes (Signed)
Patient transported to CT 

## 2022-04-30 DIAGNOSIS — I4510 Unspecified right bundle-branch block: Secondary | ICD-10-CM | POA: Diagnosis not present

## 2022-04-30 DIAGNOSIS — F0390 Unspecified dementia without behavioral disturbance: Secondary | ICD-10-CM | POA: Diagnosis not present

## 2022-04-30 DIAGNOSIS — E7800 Pure hypercholesterolemia, unspecified: Secondary | ICD-10-CM | POA: Diagnosis not present

## 2022-04-30 DIAGNOSIS — Z881 Allergy status to other antibiotic agents status: Secondary | ICD-10-CM | POA: Diagnosis not present

## 2022-04-30 DIAGNOSIS — R079 Chest pain, unspecified: Secondary | ICD-10-CM | POA: Diagnosis not present

## 2022-04-30 DIAGNOSIS — R0600 Dyspnea, unspecified: Secondary | ICD-10-CM | POA: Diagnosis not present

## 2022-04-30 DIAGNOSIS — K219 Gastro-esophageal reflux disease without esophagitis: Secondary | ICD-10-CM | POA: Diagnosis not present

## 2022-04-30 DIAGNOSIS — R0602 Shortness of breath: Secondary | ICD-10-CM | POA: Diagnosis not present

## 2022-04-30 DIAGNOSIS — R221 Localized swelling, mass and lump, neck: Secondary | ICD-10-CM | POA: Diagnosis not present

## 2022-04-30 DIAGNOSIS — I10 Essential (primary) hypertension: Secondary | ICD-10-CM | POA: Diagnosis not present

## 2022-04-30 DIAGNOSIS — K449 Diaphragmatic hernia without obstruction or gangrene: Secondary | ICD-10-CM | POA: Diagnosis not present

## 2022-04-30 DIAGNOSIS — Z888 Allergy status to other drugs, medicaments and biological substances status: Secondary | ICD-10-CM | POA: Diagnosis not present

## 2022-04-30 MED ORDER — ASPIRIN 81 MG PO CHEW: 81.0000 mg | CHEWABLE_TABLET | Freq: Every day | ORAL | 0 refills | Status: AC

## 2022-04-30 NOTE — ED Provider Notes (Signed)
Emergency Medicine Observation Re-evaluation Note  Gabriela Middleton is a 88 y.o. female, seen on rounds today.  Pt initially presented to the ED for complaints of Shortness of Breath, Fall, and Abdominal Pain Currently patient is resting comfortably. Son at the bedside who was updated. Did voice some concerns about her mobility at home.   Physical Exam  BP 139/84   Pulse 80   Temp 97.7 F (36.5 C) (Oral)   Resp 18   SpO2 98%  Physical Exam General: resting comfortably in stretcher Cardiac: No M/R/G, normal rate Lungs: CTAB Psych: calm  ED Course / MDM  EKG:EKG Interpretation  Date/Time:  Friday April 29 2022 12:37:08 EDT Ventricular Rate:  62 PR Interval:  158 QRS Duration: 133 QT Interval:  484 QTC Calculation: 492 R Axis:   20 Text Interpretation: Sinus rhythm Right bundle branch block Probable left ventricular hypertrophy qt manually calculated normal st depression in lateral and inferior leads seen on prior No significant change since last tracing Confirmed by Deno Etienne [phone]) on 04/29/2022 4:14:33 PM  I have reviewed the labs performed to date as well as medications administered while in observation.  Recent changes in the last 24 hours include: Patient had a CT of the head, chest, abdomen, and pelvis which did not reveal acute abnormalities.  Family uncomfortable taking the patient home so was seen by social work who is currently recommending physical therapy evaluation.   Plan  Current plan:  Ambulatory Difficulty - PT evaluation - SW evaluation  SOB - Pt had extensive workup which was unremarkable  - Did have mildly elevated troponin which rose by 5 points. I discussed with the patient's son. States that given her advanced dementia and goals of care would not want intervention for MI if she were to have one. Will start the patient on aspirin daily and have her fu with her PCP and cardiology regarding this.    Fransico Meadow, MD 04/30/22 [phone]

## 2022-04-30 NOTE — Discharge Instructions (Signed)
Today your mother was seen in the emergency department for your her shortness of breath.    In the emergency department she had an EKG, chest x-ray, CT scans and lab work that were reassuring.  She was seen by physical therapy who is recommending home health physical therapy.  Our social worker is working on arranging this for you.  It did show that her troponin was slightly elevated so please have her take a baby aspirin 81 mg every day follow-up with her primary doctor about this.  It did also show that a blood vessel in her chest was enlarged which may require follow-up as an outpatient as well.  Follow-up with her primary doctor in 2-3 days regarding your visit.    Return immediately to the emergency department if she experiences any of the following: Difficulty breathing, chest pain, or any other concerning symptoms.    Thank you for visiting our Emergency Department. It was a pleasure taking care of you today.

## 2022-04-30 NOTE — Progress Notes (Addendum)
CSW spoke with patient who states the family wants patient placed in SNF for rehabilitation.   Per MD and RN - patient ambulated independently with her walker however PT recommending patient receive home health.  CSW spoke with Tommi Rumps of [facility] who states the agency can accept the patient.  Madilyn Fireman, MSW, LCSW Transitions of Care  Clinical Social Worker II [phone]

## 2022-04-30 NOTE — ED Notes (Signed)
Pt ambulated with walker to the door and back to the bed with no assist

## 2022-04-30 NOTE — Evaluation (Signed)
Physical Therapy Evaluation Patient Details Name: Gabriela Middleton MRN: [id number] DOB: February 01, 1924 Today's Date: 04/30/2022  History of Present Illness  88 yo female admitted to ED with c/o shortness of breath. Family also reports recent fall while going to MD outpatient office.  Clinical Impression  On eval in ED, pt required Min A for mobility. She walked ~60 feet with use of a walker. Pt presents with general weakness, decreased activity tolerance, and impaired gait and balance. She has a history of falls and she recently experienced another while going for an outpatient MD appt. Son was present during session-he reports he has been staying with patient for that last 6 months. Discussed d/c plan-pt and son are agreeable to HHPT f/u. HHPT f/u will be beneficial due to recent fall and they can also assist with ensuring patient transitions safely back into her home environment.    Recommendations for follow up therapy are one component of a multi-disciplinary discharge planning process, led by the attending physician.  Recommendations may be updated based on patient status, additional functional criteria and insurance authorization.  Follow Up Recommendations Home health PT      Assistance Recommended at Discharge Frequent or constant Supervision/Assistance  Patient can return home with the following  Help with stairs or ramp for entrance;Assistance with cooking/housework;Assist for transportation    Equipment Recommendations None recommended by PT  Recommendations for Other Services       Functional Status Assessment Patient has had a recent decline in their functional status and demonstrates the ability to make significant improvements in function in a reasonable and predictable amount of time.     Precautions / Restrictions Precautions Precautions: Fall Restrictions Weight Bearing Restrictions: No      Mobility  Bed Mobility Overal bed mobility: Needs Assistance Bed Mobility:  Supine to Sit, Sit to Supine     Supine to sit: Min assist, HOB elevated Sit to supine: Min assist, HOB elevated   General bed mobility comments: Assist to get on/off ED stretcher. Increased time. Cues required for safety    Transfers Overall transfer level: Needs assistance Equipment used: Rollator (4 wheels) Transfers: Sit to/from Stand Sit to Stand: Min assist           General transfer comment: Assist to safely rise from ED stretcher. Cues required for safety. Increased time.    Ambulation/Gait Ambulation/Gait assistance: Min assist Gait Distance (Feet): 60 Feet Assistive device: Rollator (4 wheels) Gait Pattern/deviations: Step-through pattern, Decreased stride length       General Gait Details: Pt typically uses a standard RW for ambulation. Used rollator this session since that was what was available (with brakes engaged to help control speed). Intermittent assist to manage RW. Pt denied dizziness and dyspnea but she did report fatigue.  Stairs            Wheelchair Mobility    Modified Rankin (Stroke Patients Only)       Balance Overall balance assessment: Needs assistance, History of Falls         Standing balance support: Bilateral upper extremity supported, Reliant on assistive device for balance, During functional activity Standing balance-Leahy Scale: Poor                               Pertinent Vitals/Pain Pain Assessment Pain Assessment: No/denies pain    Home Living Family/patient expects to be discharged to:: Private residence Living Arrangements: Children Available Help at Discharge: Family Type  of Home: House Home Access: Ramped entrance       Home Layout: One level Home Equipment: Conservation officer, nature (2 wheels)      Prior Function Prior Level of Function : Independent/Modified Independent             Mobility Comments: uses RW for ambulation safety. ADLs Comments: Son reports she bathes and dresses herself.  He assists with meals.     Hand Dominance        Extremity/Trunk Assessment   Upper Extremity Assessment Upper Extremity Assessment: Generalized weakness    Lower Extremity Assessment Lower Extremity Assessment: Generalized weakness    Cervical / Trunk Assessment Cervical / Trunk Assessment: Kyphotic  Communication   Communication: HOH  Cognition Arousal/Alertness: Awake/alert Behavior During Therapy: WFL for tasks assessed/performed Overall Cognitive Status: History of cognitive impairments - at baseline                                          General Comments      Exercises     Assessment/Plan    PT Assessment All further PT needs can be met in the next venue of care (HHPT f/u)  PT Problem List Decreased strength;Decreased mobility;Decreased activity tolerance;Decreased balance;Decreased knowledge of use of DME;Decreased cognition;Decreased safety awareness       PT Treatment Interventions      PT Goals (Current goals can be found in the Care Plan section)  Acute Rehab PT Goals Patient Stated Goal: home PT Goal Formulation: With patient/family Time For Goal Achievement: 05/14/22 Potential to Achieve Goals: Good    Frequency       Co-evaluation               AM-PAC PT "6 Clicks" Mobility  Outcome Measure Help needed turning from your back to your side while in a flat bed without using bedrails?: A Little Help needed moving from lying on your back to sitting on the side of a flat bed without using bedrails?: A Little Help needed moving to and from a bed to a chair (including a wheelchair)?: A Little Help needed standing up from a chair using your arms (e.g., wheelchair or bedside chair)?: A Little Help needed to walk in hospital room?: A Little Help needed climbing 3-5 steps with a railing? : A Little 6 Click Score: 18    End of Session Equipment Utilized During Treatment: Gait belt Activity Tolerance: Patient tolerated  treatment well Patient left: in bed;with call bell/phone within reach;with family/visitor present   PT Visit Diagnosis: Muscle weakness (generalized) (M62.81);History of falling (Z91.81)    Time: 1207-1225 PT Time Calculation (min) (ACUTE ONLY): 18 min   Charges:   PT Evaluation $PT Eval Low Complexity: [place], PT Acute Rehabilitation  Office: [phone] Pager: [phone]

## 2022-05-01 DIAGNOSIS — R221 Localized swelling, mass and lump, neck: Secondary | ICD-10-CM | POA: Diagnosis not present

## 2022-05-02 DIAGNOSIS — K449 Diaphragmatic hernia without obstruction or gangrene: Secondary | ICD-10-CM | POA: Diagnosis not present

## 2022-05-02 DIAGNOSIS — K219 Gastro-esophageal reflux disease without esophagitis: Secondary | ICD-10-CM | POA: Diagnosis not present

## 2022-05-02 DIAGNOSIS — R1319 Other dysphagia: Secondary | ICD-10-CM | POA: Diagnosis not present

## 2022-05-02 DIAGNOSIS — R1310 Dysphagia, unspecified: Secondary | ICD-10-CM | POA: Diagnosis not present

## 2022-05-16 DIAGNOSIS — I77819 Aortic ectasia, unspecified site: Secondary | ICD-10-CM | POA: Diagnosis not present

## 2022-05-16 DIAGNOSIS — I679 Cerebrovascular disease, unspecified: Secondary | ICD-10-CM | POA: Diagnosis not present

## 2022-05-16 DIAGNOSIS — K449 Diaphragmatic hernia without obstruction or gangrene: Secondary | ICD-10-CM | POA: Diagnosis not present

## 2022-05-16 DIAGNOSIS — F0150 Vascular dementia without behavioral disturbance: Secondary | ICD-10-CM | POA: Diagnosis not present

## 2022-06-08 DEATH — deceased

## 2022-09-05 IMAGING — MR MR HEAD W/O CM
13 of 14 series · 44 of 48 positions shown · non-contrast
Comparison: None Available.

CLINICAL DATA: Dizziness

EXAM:
MRI HEAD WITHOUT CONTRAST
TECHNIQUE: Multiplanar, multiecho pulse sequences of the brain and surrounding
structures were obtained without intravenous contrast.

[Series 5: DWI · axial · 3.0mm · 0.92mm/px · z∈[-120,+38]mm · 6 of 110 slices shown (1 of 4)]
[im 1/110]
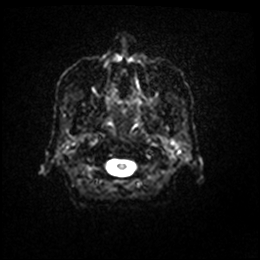
[im 22/110]
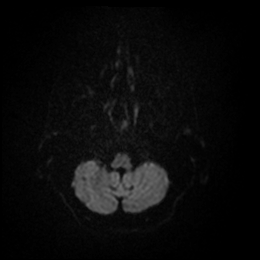
[im 44/110]
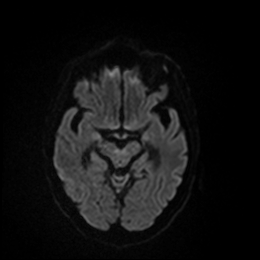
[im 66/110]
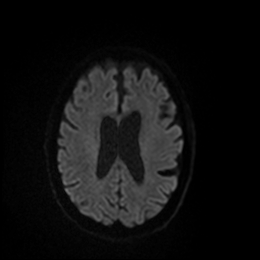
[im 88/110]
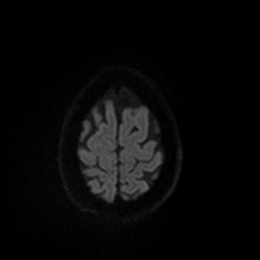
[im 110/110]
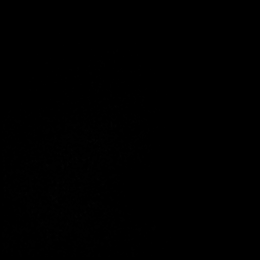

[Series 6: DWI · axial · 3.0mm · 0.92mm/px · z∈[-120,+32]mm · 4 of 53 slices shown (2 of 4)]
[im 1/53]
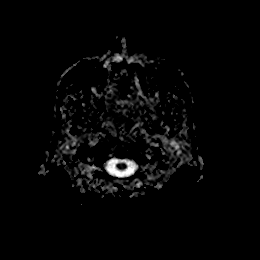
[im 18/53]
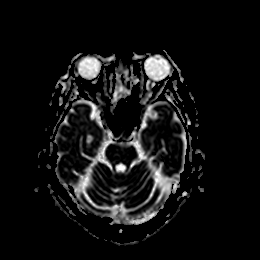
[im 35/53]
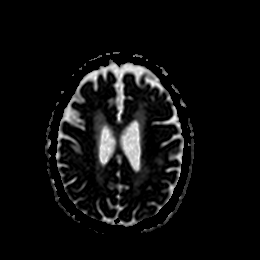
[im 53/53]
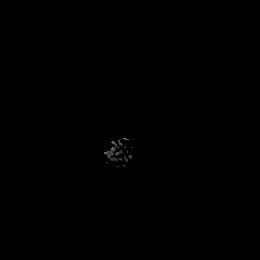

[Series 7: DWI · coronal · 4.0mm · 0.88mm/px · 6 of 82 slices shown (3 of 4)]
[im 1/82]
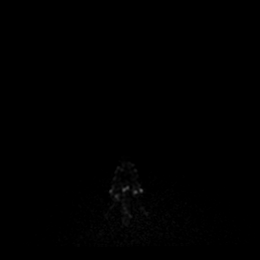
[im 17/82]
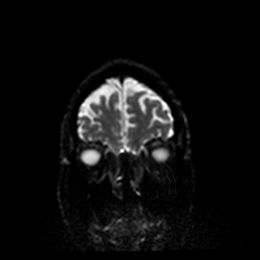
[im 33/82]
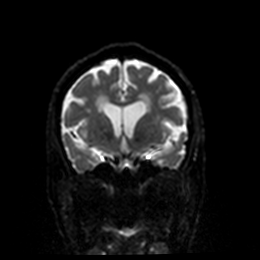
[im 49/82]
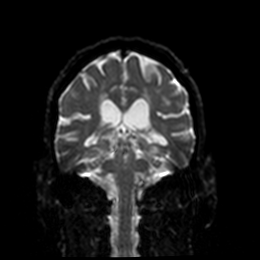
[im 65/82]
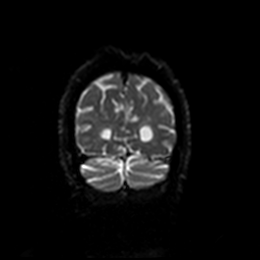
[im 82/82]
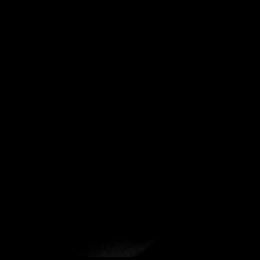

[Series 8: DWI · coronal · 4.0mm · 0.88mm/px · 3 of 40 slices shown (4 of 4)]
[im 1/40]
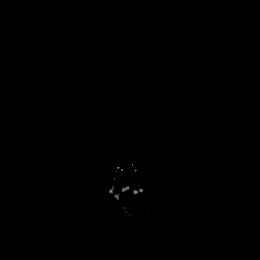
[im 20/40]
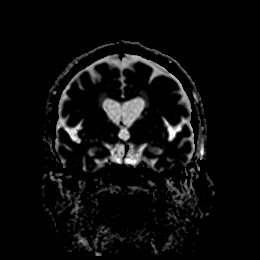
[im 40/40]
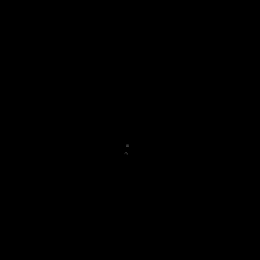

[Series 9: T1 · sagittal · 5.0mm · 0.75mm/px · 2 of 28 slices shown]
[im 1/28]
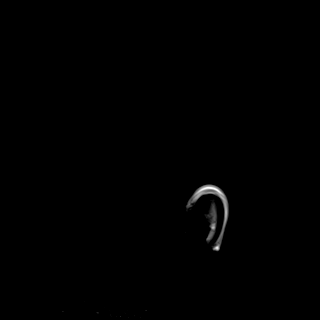
[im 28/28]
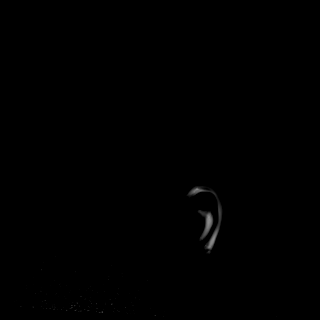

[Series 10: T2 · axial · 5.0mm · 0.75mm/px · z∈[-128,+36]mm · 2 of 29 slices shown (1 of 3)]
[im 1/29]
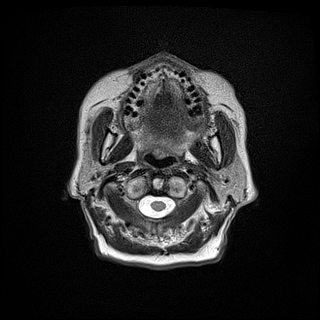
[im 29/29]
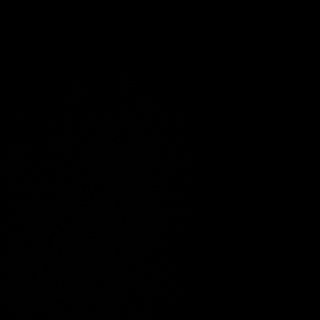

[Series 11: FLAIR · axial · 5.0mm · 0.47mm/px · z∈[-128,+36]mm · 2 of 29 slices shown]
[im 1/29]
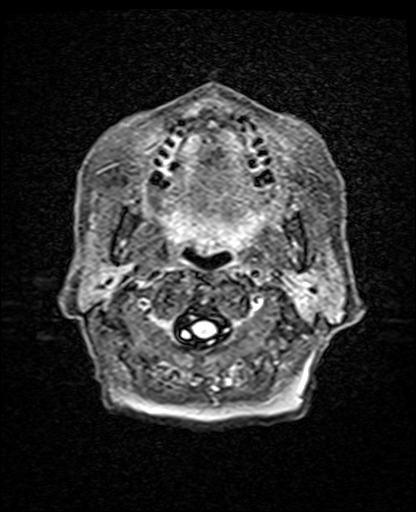
[im 29/29]
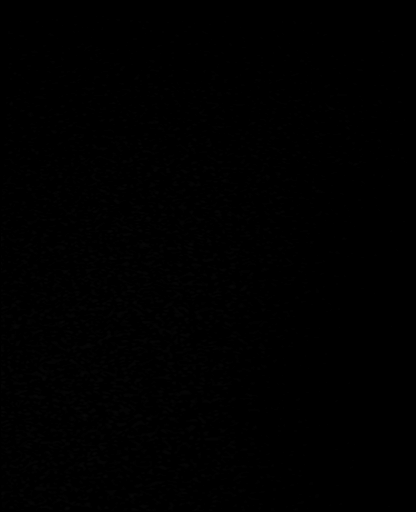

[Series 12: mag_images · axial · 3.0mm · 0.94mm/px · z∈[-122,+39]mm · 4 of 56 slices shown]
[im 1/56]
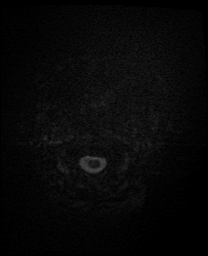
[im 19/56]
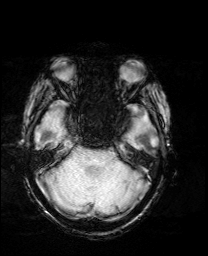
[im 37/56]
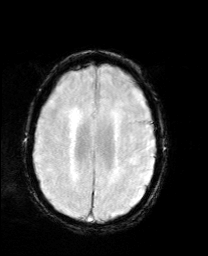
[im 56/56]
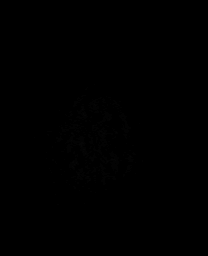

[Series 13: pha_images · axial · 3.0mm · 0.94mm/px · z∈[-122,+36]mm · 4 of 55 slices shown]
[im 1/55]
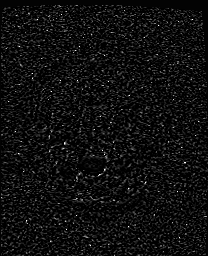
[im 19/55]
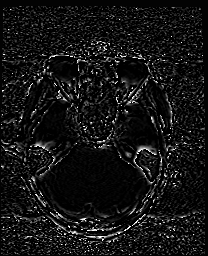
[im 37/55]
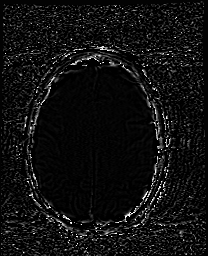
[im 55/55]
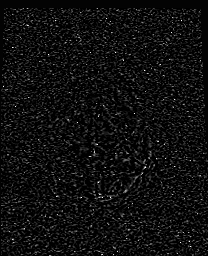

[Series 14: swi_images · axial · 3.0mm · 0.94mm/px · z∈[-122,+39]mm · 4 of 56 slices shown]
[im 1/56]
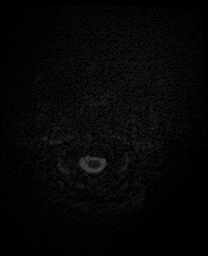
[im 19/56]
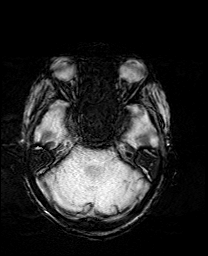
[im 37/56]
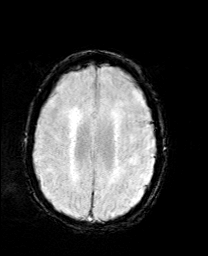
[im 56/56]
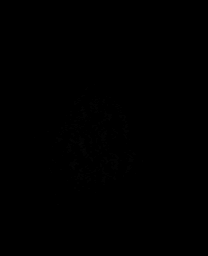

[Series 15: mip_images(sw) · axial · 24.0mm · 0.94mm/px · z∈[-111,+29]mm · 3 of 49 slices shown]
[im 1/49]
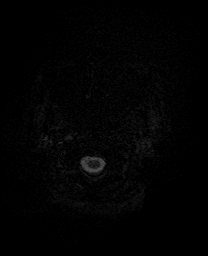
[im 25/49]
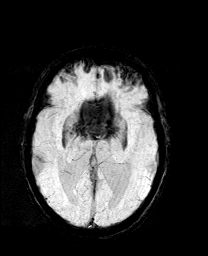
[im 49/49]
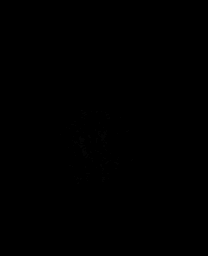

[Series 17: T2 · coronal · 5.0mm · 0.34mm/px · 2 of 35 slices shown (2 of 3)]
[im 1/35]
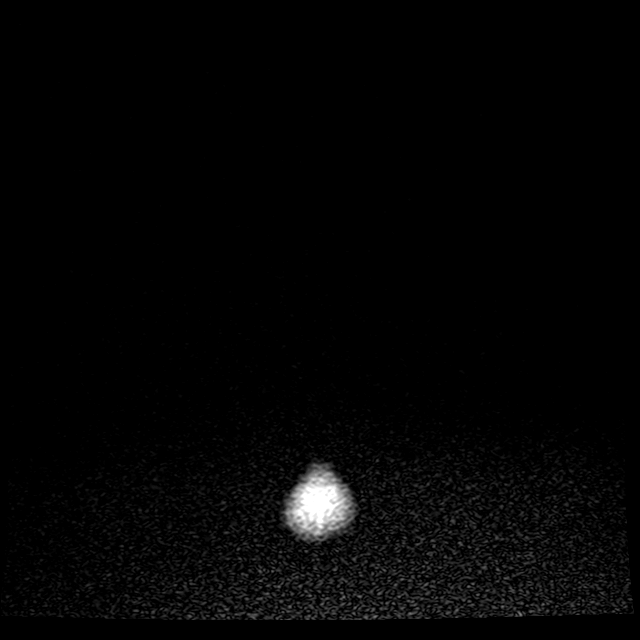
[im 35/35]
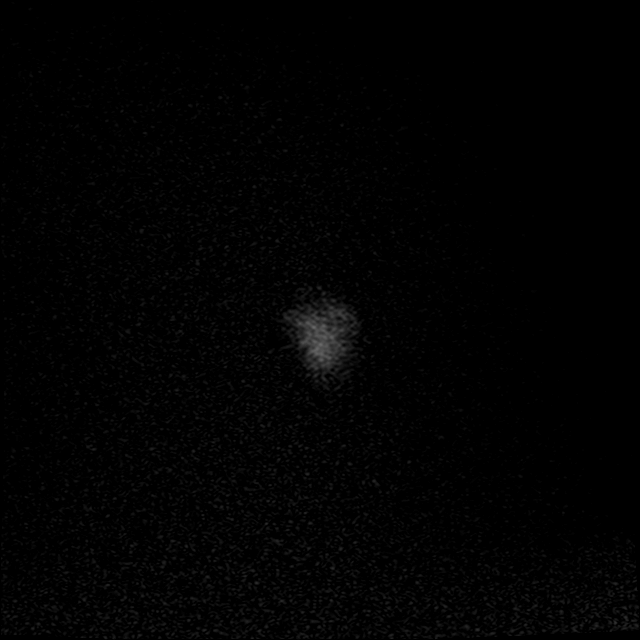

[Series 18: T2 · coronal · 5.0mm · 0.72mm/px · 2 of 32 slices shown (3 of 3)]
[im 1/32]
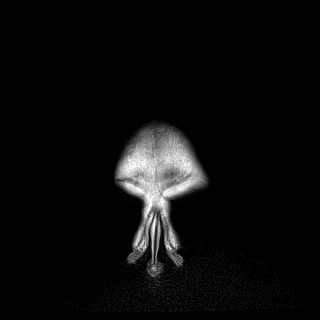
[im 32/32]
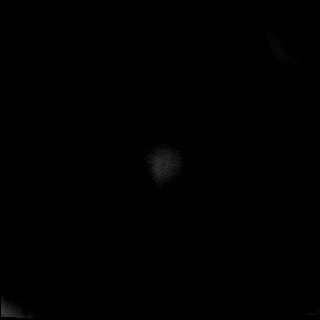

[44 of 48 positions shown; findings below may reference images not displayed]

FINDINGS: Brain: No acute infarct, mass effect or extra-axial collection. No
acute or chronic hemorrhage. There is multifocal hyperintense
T2-weighted signal within the white matter. Generalized cerebral
volume loss. Punctate, old right cerebellar infarcts. The midline
structures are normal.

Vascular: Major flow voids are preserved.

Skull and upper cervical spine: Normal calvarium and skull base.
Visualized upper cervical spine and soft tissues are normal.

Sinuses/Orbits:No paranasal sinus fluid levels or advanced mucosal
thickening. No mastoid or middle ear effusion. Normal orbits.
IMPRESSION: 1. No acute intracranial abnormality.
2. Findings of chronic ischemic microangiopathy and generalized
cerebral volume loss.
